# Patient Record
Sex: Female | Born: 1935 | Race: White | Hispanic: No | Smoking: Former smoker
Health system: Southern US, Community
[De-identification: ages and names within clinical notes are randomized; demographics above are authoritative.]

## PROBLEM LIST (undated history)

## (undated) DIAGNOSIS — I495 Sick sinus syndrome: Secondary | ICD-10-CM

## (undated) DIAGNOSIS — I10 Essential (primary) hypertension: Secondary | ICD-10-CM

## (undated) DIAGNOSIS — R55 Syncope and collapse: Secondary | ICD-10-CM

## (undated) DIAGNOSIS — Z950 Presence of cardiac pacemaker: Secondary | ICD-10-CM

## (undated) DIAGNOSIS — E079 Disorder of thyroid, unspecified: Secondary | ICD-10-CM

## (undated) DIAGNOSIS — M1990 Unspecified osteoarthritis, unspecified site: Secondary | ICD-10-CM

## (undated) HISTORY — DX: Presence of cardiac pacemaker: Z95.0

## (undated) HISTORY — DX: Sick sinus syndrome: I49.5

## (undated) HISTORY — DX: Disorder of thyroid, unspecified: E07.9

## (undated) HISTORY — PX: INSERT / REPLACE / REMOVE PACEMAKER: SUR710

## (undated) HISTORY — DX: Unspecified osteoarthritis, unspecified site: M19.90

## (undated) HISTORY — DX: Essential (primary) hypertension: I10

## (undated) HISTORY — DX: Syncope and collapse: R55

---

## 2004-10-05 ENCOUNTER — Ambulatory Visit: Payer: Self-pay | Admitting: Internal Medicine

## 2004-10-14 ENCOUNTER — Ambulatory Visit: Payer: Self-pay

## 2004-10-15 ENCOUNTER — Ambulatory Visit: Payer: Self-pay

## 2004-10-19 ENCOUNTER — Ambulatory Visit: Payer: Self-pay | Admitting: Internal Medicine

## 2004-10-19 ENCOUNTER — Ambulatory Visit (HOSPITAL_COMMUNITY): Admission: RE | Admit: 2004-10-19 | Discharge: 2004-10-19 | Payer: Self-pay | Admitting: Internal Medicine

## 2004-11-03 ENCOUNTER — Ambulatory Visit: Payer: Self-pay

## 2004-11-23 ENCOUNTER — Ambulatory Visit: Payer: Self-pay

## 2004-12-01 ENCOUNTER — Ambulatory Visit: Payer: Self-pay | Admitting: Internal Medicine

## 2004-12-06 ENCOUNTER — Ambulatory Visit: Payer: Self-pay | Admitting: Internal Medicine

## 2004-12-10 ENCOUNTER — Ambulatory Visit (HOSPITAL_COMMUNITY): Admission: RE | Admit: 2004-12-10 | Discharge: 2004-12-11 | Payer: Self-pay | Admitting: Internal Medicine

## 2004-12-10 ENCOUNTER — Ambulatory Visit: Payer: Self-pay | Admitting: Internal Medicine

## 2004-12-27 ENCOUNTER — Ambulatory Visit: Payer: Self-pay

## 2005-04-18 ENCOUNTER — Ambulatory Visit: Payer: Self-pay | Admitting: Internal Medicine

## 2005-06-14 ENCOUNTER — Ambulatory Visit: Payer: Self-pay | Admitting: Internal Medicine

## 2005-10-24 ENCOUNTER — Ambulatory Visit: Payer: Self-pay | Admitting: Internal Medicine

## 2005-11-17 ENCOUNTER — Ambulatory Visit: Payer: Self-pay | Admitting: Internal Medicine

## 2006-01-06 ENCOUNTER — Ambulatory Visit: Payer: Self-pay | Admitting: Cardiology

## 2006-01-30 ENCOUNTER — Inpatient Hospital Stay (HOSPITAL_COMMUNITY): Admission: RE | Admit: 2006-01-30 | Discharge: 2006-02-02 | Payer: Self-pay | Admitting: Orthopedic Surgery

## 2006-04-07 ENCOUNTER — Ambulatory Visit: Payer: Self-pay | Admitting: Internal Medicine

## 2006-06-22 ENCOUNTER — Ambulatory Visit: Payer: Self-pay | Admitting: Internal Medicine

## 2006-12-27 ENCOUNTER — Ambulatory Visit: Payer: Self-pay | Admitting: Internal Medicine

## 2007-03-28 ENCOUNTER — Ambulatory Visit: Payer: Self-pay | Admitting: Internal Medicine

## 2007-07-19 ENCOUNTER — Ambulatory Visit: Payer: Self-pay | Admitting: Internal Medicine

## 2007-10-23 ENCOUNTER — Ambulatory Visit: Payer: Self-pay | Admitting: Internal Medicine

## 2008-04-15 ENCOUNTER — Ambulatory Visit: Payer: Self-pay | Admitting: Internal Medicine

## 2008-08-21 ENCOUNTER — Ambulatory Visit: Payer: Self-pay | Admitting: Internal Medicine

## 2008-11-27 DIAGNOSIS — M1990 Unspecified osteoarthritis, unspecified site: Secondary | ICD-10-CM | POA: Insufficient documentation

## 2008-11-27 DIAGNOSIS — E669 Obesity, unspecified: Secondary | ICD-10-CM

## 2008-11-29 ENCOUNTER — Encounter (INDEPENDENT_AMBULATORY_CARE_PROVIDER_SITE_OTHER): Payer: Self-pay | Admitting: *Deleted

## 2008-12-11 ENCOUNTER — Ambulatory Visit: Payer: Self-pay | Admitting: Internal Medicine

## 2008-12-11 DIAGNOSIS — I495 Sick sinus syndrome: Secondary | ICD-10-CM

## 2009-03-20 ENCOUNTER — Encounter: Payer: Self-pay | Admitting: Internal Medicine

## 2009-04-17 ENCOUNTER — Encounter: Payer: Self-pay | Admitting: Internal Medicine

## 2009-05-11 ENCOUNTER — Ambulatory Visit: Payer: Self-pay | Admitting: Internal Medicine

## 2009-05-28 ENCOUNTER — Encounter: Payer: Self-pay | Admitting: Internal Medicine

## 2009-08-14 ENCOUNTER — Encounter: Payer: Self-pay | Admitting: Internal Medicine

## 2009-09-03 ENCOUNTER — Encounter: Payer: Self-pay | Admitting: Internal Medicine

## 2009-09-11 ENCOUNTER — Encounter: Payer: Self-pay | Admitting: Internal Medicine

## 2009-09-21 ENCOUNTER — Ambulatory Visit: Payer: Self-pay | Admitting: Internal Medicine

## 2009-10-12 ENCOUNTER — Encounter: Payer: Self-pay | Admitting: Internal Medicine

## 2010-03-15 ENCOUNTER — Ambulatory Visit: Payer: Self-pay | Admitting: Internal Medicine

## 2010-05-21 ENCOUNTER — Telehealth: Payer: Self-pay | Admitting: Internal Medicine

## 2010-06-21 ENCOUNTER — Encounter: Payer: Self-pay | Admitting: Internal Medicine

## 2010-08-06 ENCOUNTER — Encounter (INDEPENDENT_AMBULATORY_CARE_PROVIDER_SITE_OTHER): Payer: Self-pay | Admitting: *Deleted

## 2010-08-12 ENCOUNTER — Encounter: Payer: Self-pay | Admitting: Internal Medicine

## 2010-09-07 NOTE — Letter (Signed)
Summary: Device-Delinquent Phone Journalist, newspaper, Main Office  [address]   [place], [place] [address]   Phone: [phone]  Fax: [phone]     September 11, 2009 MRN: [id number]   [facility] [address] [place], [place]  [address]   Dear [name],  According to our records, you were scheduled for a device phone transmission on   August 12, 2009.     We did not receive any results from this check.  If you transmitted on your scheduled day, please call us to help troubleshoot your system.  If you forgot to send your transmission, please send one upon receipt of this letter.  Thank you,   Architectural technologist Device Clinic

## 2010-09-07 NOTE — Letter (Signed)
Summary: Device-Delinquent Phone Journalist, newspaper, Main Office  [address]   [place], [place] [address]   Phone: [phone]  Fax: [phone]     June 21, 2010 MRN: [id number]   [facility] [address] [place], [place]  [address]   Dear Ms. Dufault,  According to our records, you were scheduled for a device phone transmission on 06-17-2010.     We did not receive any results from this check.  If you transmitted on your scheduled day, please call us to help troubleshoot your system.  If you forgot to send your transmission, please send one upon receipt of this letter.  Thank you,   Architectural technologist Device Clinic

## 2010-09-07 NOTE — Progress Notes (Signed)
Summary: QUESTION ON MEDS  Phone Note Call from Patient Call back at [phone]   Caller: SOn/JIM Reason for Call: Talk to Nurse Summary of Call: PT SON JIM HAS QUESTION ON PT MEDS. PT SON IS NOT SURE WHAT MEDS DR. KLEIN PT NEEDS TO TAKE.  JIM WILL LIKE TO TALK TO A NURSE TO GET HIS MOTHER MEDS IN ORDER. Initial call taken by: Roe Coombs,  May 21, 2010 4:05 PM  Follow-up for Phone Call        Pt's son just wanted to know which meds. his mother was taking that Dr. Graciela Husbands has prescribed. I went over the meds. from the last OV with Dr. Graciela Husbands. He repeated those back to me & seems to understand. Whitney Maeola Sarah RN  May 21, 2010 4:19 PM  Follow-up by: Whitney Maeola Sarah RN,  May 21, 2010 4:19 PM

## 2010-09-07 NOTE — Cardiovascular Report (Signed)
Summary: Office Visit   Office Visit   Imported By: Roderic Ovens 03/17/2010 13:22:43  _____________________________________________________________________  External Attachment:    Type:   Image     Comment:   External Document

## 2010-09-07 NOTE — Assessment & Plan Note (Signed)
Summary: pc2/medtronic   Visit Type:  Follow-up Primary Provider:  Dr Barrie Folk  CC:  Pc2.  History of Present Illness: Mrs. Veale he seen in followup for sinus bradycardia and paroxysmal atrial arrhythmias. She's had some intermittent palpitations.    Exercise has been limited by discomfort in her hips. Her weight is up another few pounds.    issues recently has been memory.  The patient denies SOB, chest pain, edema or palpitations   Current Medications (verified): 1)  Metoprolol Tartrate 50 Mg Tabs (Metoprolol Tartrate) .... Take One Tab Two Times A Day 2)  Lotrel 5-20 Mg Caps (Amlodipine Besy-Benazepril Hcl) .Marland Kitchen.. 1 Cap By Mouth Once Daily 3)  Meloxicam 7.5 Mg Tabs (Meloxicam) .Marland Kitchen.. 1 Tab Once Daily 4)  Levothyroxine Sodium 100 Mcg Tabs (Levothyroxine Sodium) .... Take 1 Tab Once Daily 5)  Simvastatin 40 Mg Tabs (Simvastatin) .... Take One Tablet At Bedtime  Allergies (verified): No Known Drug Allergies  Past History:  Past Medical History: Last updated: 11/27/2008 PAST MEDICAL HISTORY:  1.  Symptomatic bradycardia/recurrent syncope.  2.  Hypothyroidism.  3.  Degenerative joint disease.  Vital Signs:  Patient profile:   76 year old female Height:      63 inches Weight:      175.50 pounds BMI:     31.20 Pulse rate:   62 / minute Pulse rhythm:   regular Resp:     18 per minute BP sitting:   128 / 80  (left arm) Cuff size:   large  Vitals Entered By: Vikki Ports (March 15, 2010 11:08 AM)  Physical Exam  General:  The patient was alert and oriented in no acute distress.Neck veins were flat, carotids were brisk. Lungs were clear. Heart sounds were regular without murmurs or gallops. Abdomen was soft with active bowel sounds. There is no clubbing cyanosis or edema.    EKG  Procedure date:  03/15/2010  Findings:      atrial pacing at 62 Intervals 0.16 5.07/.41  PPM Specifications Following MD:  Sherryl Manges, MD     PPM Vendor:  Medtronic     PPM Model  Number:  [id number]     PPM Serial Number:  [id number] PPM DOI:  12/10/2004     PPM Implanting MD:  Sherryl Manges, MD  Lead 1    Location: RA     DOI: 12/10/2004     Model #: [id number]     Serial #: [id number]     Status: active Lead 2    Location: RV     DOI: 12/10/2004     Model #: [id number]     Serial #: [id number]     Status: active   Indications:  Syncope    PPM Follow Up Battery Voltage:  2.99 V     Pacer Dependent:  No       PPM Device Measurements Atrium  Amplitude: 3.8 mV, Impedance: 464 ohms, Threshold: 0.5 V at 0.40 msec Right Ventricle  Amplitude: 6.0 mV, Impedance: 576 ohms, Threshold: 1.0 V at 0.4 msec  Episodes MS Episodes:  0     Ventricular High Rate:  1     Atrial Pacing:  57.3%     Ventricular Pacing:  <0.2%  Parameters Mode:  MVP(R)     Lower Rate Limit:  60     Upper Rate Limit:  130 Paced AV Delay:  180     Sensed AV Delay:  150 Next Remote Date:  06/17/2010  Next Cardiology Appt Due:  03/09/2011 Tech Comments:  1 MONITORED VT EPISODE LASTING 6 SECONDS.  NORMAL DEVICE FUNCTION.  NO CHANGES MADE.  CARELINK CHECK 06-17-10. ROV IN 12 MTHS W/SK. Vella Kohler  March 15, 2010 11:37 AM  Impression & Recommendations:  Problem # 1:  SICK SINUS/ TACHY-BRADY SYNDROME (ICD-427.81)  Stable; status post pacer Her updated medication list for this problem includes:    Metoprolol Tartrate 50 Mg Tabs (Metoprolol tartrate) .Marland Kitchen... Take one tab two times a day    Lotrel 5-20 Mg Caps (Amlodipine besy-benazepril hcl) .Marland Kitchen... 1 cap by mouth once daily  Her updated medication list for this problem includes:    Metoprolol Tartrate 50 Mg Tabs (Metoprolol tartrate) .Marland Kitchen... Take one tab two times a day    Lotrel 5-20 Mg Caps (Amlodipine besy-benazepril hcl) .Marland Kitchen... 1 cap by mouth once daily  Orders: EKG w/ Interpretation ([address])  Problem # 2:  PACEMAKER, PERMANENT-MEDTRONIC ENRHYTHM P1501DR (ICD-V45.01)  Device parameters and data were reviewed and no changes were made  Orders: EKG w/  Interpretation ([address])  Patient Instructions: 1)  Your physician wants you to follow-up in: 1 YEAR with Dr Graciela Husbands in [place].  You will receive a reminder letter in the mail two months in advance. If you don't receive a letter, please call our office to schedule the follow-up appointment. 2)  Your physician recommends that you continue on your current medications as directed. Please refer to the Current Medication list given to you today.

## 2010-09-07 NOTE — Letter (Signed)
Summary: Device-Delinquent Phone Journalist, newspaper, Main Office  [address]   [place], [place] [address]   Phone: [phone]  Fax: [phone]     August 14, 2009 MRN: [id number]   [facility] [address] [place], [place]  [address]   Dear Ms. Azbell,  According to our records, you were scheduled for a device phone transmission on  August 12, 2009.     We did not receive any results from this check.  If you transmitted on your scheduled day, please call us to help troubleshoot your system.  If you forgot to send your transmission, please send one upon receipt of this letter.  Thank you,   Architectural technologist Device Clinic

## 2010-09-07 NOTE — Cardiovascular Report (Signed)
Summary: Office Visit Remote   Office Visit Remote   Imported By: Roderic Ovens 10/14/2009 10:05:58  _____________________________________________________________________  External Attachment:    Type:   Image     Comment:   External Document

## 2010-09-07 NOTE — Letter (Signed)
Summary: Remote Device Check  [employer], Main Office  [address]   [place], [place] [address]   Phone: [phone]  Fax: [phone]     October 12, 2009 MRN: [id number]   [facility] [address] [place], [place]  [address]   Dear Ms. Pullin,   Your remote transmission was recieved and reviewed by your physician.  All diagnostics were within normal limits for you.    ___X___Your next office visit is scheduled for:    MAY 2011 WITH DR Graciela Husbands. Please call our office to schedule an appointment.    Sincerely,  Proofreader

## 2010-09-09 NOTE — Cardiovascular Report (Signed)
Summary: Certified Letter Signed - Patient (not doing f/u)  Certified Letter Signed - Patient (not doing f/u)   Imported By: Debby Freiberg 08/25/2010 17:08:13  _____________________________________________________________________  External Attachment:    Type:   Image     Comment:   External Document

## 2010-09-09 NOTE — Letter (Signed)
Summary: Device-Delinquent Phone Journalist, newspaper, Main Office  [address]   [place], [place] [address]   Phone: [phone]  Fax: [phone]     August 06, 2010 MRN: [id number]   [facility] [address] [place], [place]  [address]   Dear Ms. Rumbaugh,  According to our records, you were scheduled for a device phone transmission on  06-17-2010 .     We did not receive any results from this check.  If you transmitted on your scheduled day, please call us to help troubleshoot your system.  If you forgot to send your transmission, please send one upon receipt of this letter.  Thank you,  Vella Kohler  August 06, 2010 1:55 PM   [facility] certified

## 2010-10-01 ENCOUNTER — Encounter: Payer: Self-pay | Admitting: Internal Medicine

## 2010-10-01 ENCOUNTER — Encounter (INDEPENDENT_AMBULATORY_CARE_PROVIDER_SITE_OTHER): Payer: Medicare Other | Admitting: Internal Medicine

## 2010-10-01 DIAGNOSIS — Z950 Presence of cardiac pacemaker: Secondary | ICD-10-CM

## 2010-10-01 DIAGNOSIS — I10 Essential (primary) hypertension: Secondary | ICD-10-CM

## 2010-10-01 DIAGNOSIS — I498 Other specified cardiac arrhythmias: Secondary | ICD-10-CM

## 2010-10-01 DIAGNOSIS — I472 Ventricular tachycardia: Secondary | ICD-10-CM

## 2010-10-14 NOTE — Cardiovascular Report (Signed)
Summary: Office Visit   Office Visit   Imported By: Roderic Ovens 10/07/2010 15:52:26  _____________________________________________________________________  External Attachment:    Type:   Image     Comment:   External Document

## 2010-10-14 NOTE — Assessment & Plan Note (Signed)
Summary: physician clinic   Allergies: No Known Drug Allergies   PPM Specifications Following MD:  Sherryl Manges, MD     PPM Vendor:  Medtronic     PPM Model Number:  [id number]     PPM Serial Number:  [id number] PPM DOI:  12/10/2004     PPM Implanting MD:  Sherryl Manges, MD  Lead 1    Location: RA     DOI: 12/10/2004     Model #: 1093     Serial #: [id number]     Status: active Lead 2    Location: RV     DOI: 12/10/2004     Model #: 2542     Serial #: [id number]     Status: active   Indications:  Syncope    PPM Follow Up Pacer Dependent:  No      Parameters Mode:  MVP(R)     Lower Rate Limit:  60     Upper Rate Limit:  130 Paced AV Delay:  180     Sensed AV Delay:  150

## 2010-10-20 ENCOUNTER — Encounter: Payer: Self-pay | Admitting: Cardiovascular Disease

## 2010-10-29 NOTE — Letter (Deleted)
October 01, 2010    RE:  [name], [name] MRN:  [id number]  /  DOB:  May 24, 1929  [name] is seen in follow-up for sinus node dysfunction for which she is status post pacemaker implantation.  She has had no recurrent syncope.  She continues to adjust to life after the loss of her husband of many, many years.  Palpitations previously identified have been largely quiescent.  She denies chest pain or shortness of breath.  MEDICATIONS:  Currently include: 1. Amlodipine/benazepril 5/20. 2. Levothyroxine 150. 3. Metoprolol tartrate 50 b.i.d.  PHYSICAL EXAMINATION:  VITAL SIGNS:  On examination her blood pressure is still mildly elevated at 149/83.  Her pulse was 74. LUNGS:  Clear. HEART:  Sounds were regular. ABDOMEN:  Soft with active bowel sounds. EXTREMITIES:  Without edema. SKIN:  Warm and dry. NEURO:  She was alert and oriented, in no acute distress.  Neurological exam was grossly. MUSCULOSKELETAL:  No major musculoskeletal deformities.  Her pacemaker was interrogated and demonstrated a Medtronic in rhythm with a battery voltage of 2.99.  Impedances were [phone]-V, amplitudes 2.8-P 5.6-R, thresholds today were  0.5/0.4 and 1 volt/0.4.  Heart rate excursion was adequate, atrial pacing was noted 57% of the time she does not ventricularly pace.  IMPRESSION: 1. Sinus node dysfunction. 2. Hypertension. 3. Status post pacemaker for #1. 4. Nonsustained ventricular tachycardia.  The patient is doing quite well.  Previous assessments of left ventricular function undertaken __________  were normal.  She continues to accommodate to the loss of her husband.  Ejection fraction was 55% most recently, March of 2006, with Myoview scanning.  We will plan to see her again in six months' time in the device clinic.   Sincerely,     Duke Salvia, MD, [facility]   SCK/MedQ  DD: 10/01/2010  DT: 10/01/2010  Job #: [id number]

## 2010-12-10 NOTE — Assessment & Plan Note (Signed)
[facility]                       [place] CARDIOLOGY OFFICE NOTE  NAME:PAGEChelsae, [name]                       MRN:          [id number] DATE:10/01/2010                            DOB:          08/23/1928   [name] is seen in follow-up for sinus node dysfunction for which she is status post pacemaker implantation.  She has had no recurrent syncope.  She continues to adjust to life after the loss of her husband of many, many years.  Palpitations previously identified have been largely quiescent.  She denies chest pain or shortness of breath.  MEDICATIONS:  Currently include: 1. Amlodipine/benazepril 5/20. 2. Levothyroxine 150. 3. Metoprolol tartrate 50 b.i.d.  PHYSICAL EXAMINATION:  VITAL SIGNS:  On examination her blood pressure is still mildly elevated at 149/83.  Her pulse was 74. LUNGS:  Clear. HEART:  Sounds were regular. ABDOMEN:  Soft with active bowel sounds. EXTREMITIES:  Without edema. SKIN:  Warm and dry. NEURO:  She was alert and oriented, in no acute distress.  Neurological exam was grossly. MUSCULOSKELETAL:  No major musculoskeletal deformities.  Her pacemaker was interrogated and demonstrated a Medtronic in rhythm with a battery voltage of 2.99.  Impedances were [phone]-V, amplitudes 2.8-P 5.6-R, thresholds today were  0.5/0.4 and 1 volt/0.4.  Heart rate excursion was adequate, atrial pacing was noted 57% of the time she does not ventricularly pace.  IMPRESSION: 1. Sinus node dysfunction. 2. Hypertension. 3. Status post pacemaker for #1. 4. Nonsustained ventricular tachycardia.  The patient is doing quite well.  Previous assessments of left ventricular function undertaken __________  were normal.  She continues to accommodate to the loss of her husband.  Ejection fraction was 55% most recently, March of 2006, with Myoview scanning.  We will plan to see her again in six months' time in the device clinic.   Duke Salvia, MD, [facility]   SCK/MedQ  DD: 10/01/2010  DT: 10/01/2010  Job #: [id number]

## 2010-12-21 NOTE — Assessment & Plan Note (Signed)
[facility]                         ELECTROPHYSIOLOGY OFFICE NOTE   NAME:PAGEYoulanda, Tomassetti                       MRN:          [id number]  DATE:07/19/2007                            DOB:          10-18-1928    Mrs. Nardo has seen her husband recently pass away at [facility]  following a fall.  She has syncope in the context of bradycardia and she  is status post pacemaker implantation without recurrent events.   MEDICATIONS:  Include Toprol 50, Lotrel, Synthroid 112.   EXAMINATION:  Her blood pressure is 136/86, pulse is 64.  Her lungs were  clear.  Heart sounds were regular. Extremities were without edema.   INTERROGATION OF HER MEDTRONIC PULSE GENERATOR:  Demonstrates a P wave  of 2.6 with impedance of 440, threshold of 0.5 and 0.4, the R wave is 74  with impedance of 40 and threshold of 0.5 and 0.4.   IMPRESSION:  1. Bradycardia.  2. Syncope.  3. Status post pacer for the above.   Mrs. Renn is stable and we will see her again in 1 year's time.     Duke Salvia, MD, [facility]  Electronically Signed    SCK/MedQ  DD: 07/19/2007  DT: 07/19/2007  Job #: [id number]

## 2010-12-24 NOTE — Assessment & Plan Note (Signed)
[facility]                           ELECTROPHYSIOLOGY OFFICE NOTE   NAME:PAGEChristinna, [name]                       MRN:          [id number]  DATE:04/07/2006                            DOB:          1929/05/10    [name] is seen.  She has a history of 6 beats of bradycardia, status post  pacemaker implantation.  She is doing well without complaint.   MEDICATIONS:  Reviewed and are notable for the inter current up titration of  her Synthroid.   PHYSICAL EXAMINATION:  VITAL SIGNS:  Her blood pressure was 143/86.  Her  pulse was 70.  LUNGS:  Clear.  HEART:  Sounds were regular.  EXTREMITIES:  Without edema.   Interrogation of her Medtronic pulse generator demonstrates a P wave 3.7  with impedance of 480 and a threshold of a 0.5 at __________ with an R wave  of 8.1 and __________ threshold of 0.5 at 0.4 __________ 2 and there was not  an episode of nonsustained ventricular tachycardia.  This occurred in the  context of a normal heart.   IMPRESSION:  1. A 6 __________  .  2. Status post __________  without recurrence.  3. Status post recent hip surgery.  4. Nonsustained ventricular tachycardia __________  .   PLAN:  __________  right now without further cardiac evaluation not  withstanding nonsustained VT.  Her pacemaker function is fine.                                   Duke Salvia, MD, [facility]   SCK/MedQ  DD:  04/07/2006  DT:  04/07/2006  Job #:  [id number]   cc:   Desmond Dike, Dr.

## 2010-12-24 NOTE — Letter (Signed)
June 22, 2006    Desmond Dike, M.D.  [address]  [place], [place] [address]   RE:  KHAILA, VELARDE  MRN:  [id number]  /  DOB:  01-04-1929   Dear Jonny Ruiz:   I hope this letter finds you well and I wish you guys a happy Thanksgiving  and a merry Christmas. Ms. Smotherman comes in today, she has had these funny  spells a couple of weeks ago where she was just nauseated and hot and did  not feel very well. The symptoms abated. She had a perisystolic just a  couple of months also. She is back now to feeling really quite well.  Her  medications are unchanged.   EXAMINATION:  VITAL SIGNS: Her blood pressure is 136/78, her pulse is 65.  LUNGS: Clear.  HEART: Sounds are regular.  EXTREMITIES: Were without edema.   IMPRESSION:  1. Sinus node dysfunction status post pacemaker implantation.  2. Dizzy spells that sound vaso depressive with nausea and other things,      question trigger.  3. Diastolic heart failure.  4. Obesity.   Ms. Zuk is stable. Will plan to see her again in 1 year's time. If there is  anything I can do in the interim, John, please do not hesitate to contact  me.    Sincerely,      Duke Salvia, MD, [facility]  Electronically Signed    SCK/MedQ  DD: 06/22/2006  DT: 06/22/2006  Job #: [id number]

## 2010-12-24 NOTE — Op Note (Signed)
NAME:  [name], [name]              ACCOUNT NO.:  [id number]   MEDICAL RECORD NO.:  [id number]          PATIENT TYPE:  OIB   LOCATION:  2899                         FACILITY:  [facility]   PHYSICIAN:  Duke Salvia, M.D.  DATE OF BIRTH:  06/22/1929   DATE OF PROCEDURE:  10/19/2004  DATE OF DISCHARGE:                                 OPERATIVE REPORT   PREOPERATIVE DIAGNOSIS:  Recurrent syncope.   POSTOPERATIVE DIAGNOSIS:  Recurrent syncope.   PROCEDURE PERFORMED:  Implantable loop insertion.   DESCRIPTION OF PROCEDURE:  Following the obtaining of informed consent, the  patient was brought to the electrophysiology laboratory and placed on the  fluoroscopic table in supine position.  After routine prep and drape and  transcutaneous mapping, an incision was made and carried down to the layer  of the prepectoral fascia using electrocautery and sharp dissection.  A  pocket was formed similarly, hemostasis was obtained.  Thereafter hemostasis  was obtained.  Two 0 silk sutures were placed at the cephalad portion of the  pocket and these were used to affix a Reveal Plus monitor [id number].  The  wound was then closed in three layers in normal fashion.  The wound was  washed and dried and benzoin Steri-Strip dressing was applied.  Sponge,  needle and instrument counts were correct at the end of the procedure  according to the staff.  The patient tolerated the procedure without  apparent complication.   The patient's procedure was somewhat more tedious than normal because of the  patient's obesity and the need to create a pocket nearly an inch and a half  below the surface.      SCK/MEDQ  D:  10/19/2004  T:  10/19/2004  Job:  [id number]   cc:   Cliffton Asters, M.D.  [address] [place]  [place] [address]  Fax: [phone]   Electrophys. lab.   [place] Pacemaker cl.

## 2010-12-24 NOTE — Discharge Summary (Signed)
NAME:  [name], [name]              ACCOUNT NO.:  [id number]   MEDICAL RECORD NO.:  [id number]          PATIENT TYPE:  OIB   LOCATION:  3729                         FACILITY:  [facility]   PHYSICIAN:  Maple Mirza, P.A. DATE OF BIRTH:  1928-11-10   DATE OF ADMISSION:  12/10/2004  DATE OF DISCHARGE:  12/11/2004                                 DISCHARGE SUMMARY   DISCHARGE DIAGNOSES:  1.  Symptomatic bradycardia/presyncope/syncope.  2.  Status post loop implant.  3.  Implantation of Medtronic pacemaker, Dec 10, 2004.  4.  Discharging day number one, status post implantation of a pacemaker,      status post proper interrogation, status post chest x-ray, status post      incision check.   SECONDARY DIAGNOSES:  1.  Hypothyroidism.  2.  Degenerative joint disease.  3.  Status post cholecystectomy.  4.  Status post appendectomy.  5.  Status post left hip arthroplasty.   PROCEDURE:  1.  Dec 10, 2004:  Implantation of Medtronic permanent pacemaker by Dr.      Sherryl Manges.  2.  Dec 10, 2004:  Status post Medtronic loop recorder explant.   DISCHARGE DISPOSITION:  Patient discharged on day number one after  implantation of pacemaker.  She has been afebrile.  She has had no cardiac  dysrhythmias, no respiratory distress.  She has had no recurrence of  syncope.   DISCHARGE MEDICATIONS:  1.  Unithroid 150 mcg one tab for 3 days, one-half tab for 4 days.  2.  Toprol-XL 100 mg one-half tab daily.  3.  Mobic 7.5 mg daily.   SPECIAL INSTRUCTIONS:  The patient is to remove the bandage on the morning  of Dec 11, 2004, and leave the incision open to the air.  She is asked to  keep the incision dry over the next seven days and sponge bathe until  Friday, May 12th.  Tylenol has been ordered for pain, 1-2 tablets q.4-6h as  needed.  A mobility sheet has been given to the patient describing  instructions for moving of the left arm for the next week.  Follow up is at  the [facility], [facility], [address] on  Monday, May 22nd, at 3:30, and she will see Dr. Graciela Husbands in three months.  As  always, we will call with that appointment.   BRIEF HISTORY:  [name] is a 77 year old female.  She has a history of  recurrent syncope.  She was seen by Dr. Graciela Husbands first in February of 2006 with  the recommendation at that time for a loop recorder since the syncope was of  unclear etiology.  In mid-April of 2006, she had a marked episode of  presyncope.  The recorder demonstrated a sinus bradycardia with a heart rate  in the 20s.  She is here for a loop explant and pacemaker implantation.  The  patient had an Adenosine-Myoview in March of 2006, and ejection fraction was  55%, a small defect of the apex with a question of trivial ischemia, left  ventricular wall motion is normal.  The  patient denies any prior history of  diabetes, myocardial infarction, cerebrovascular accident, hypertension,  dyslipidemia, pulmonary embolism, deep venous thrombosis, seizure.  Patient  presents May 5th for explantation of loop recorder and implantation of  permanent pacemaker.   HOSPITAL COURSE:  The patient presenting electively May 5th for procedure.  The loop recorder was explanted successfully, with implantation of Medtronic  EnRhythm permanent pacemaker by Dr. Graciela Husbands.  Patient has had no complaints in  the post-procedure period and discharged day number one with the medications  and followup as dictated.      GM/MEDQ  D:  12/10/2004  T:  12/11/2004  Job:  [id number]   cc:   Desmond Dike, M.D.   Duke Salvia, M.D.

## 2010-12-24 NOTE — Op Note (Signed)
NAME:  [name], [name]              ACCOUNT NO.:  [id number]   MEDICAL RECORD NO.:  [id number]          PATIENT TYPE:  INP   LOCATION:  2899                         FACILITY:  [facility]   PHYSICIAN:  Mila Homer. Sherlean Foot, M.D. DATE OF BIRTH:  [birth year]   DATE OF PROCEDURE:  01/30/2006  DATE OF DISCHARGE:                                 OPERATIVE REPORT   SURGEON:  Mila Homer. Sherlean Foot, M.D.   ASSISTANT:  Richardean Canal, P.A.   ANESTHESIA:  General.   PREOPERATIVE DIAGNOSIS:  Right hip osteoarthritis.   POSTOPERATIVE DIAGNOSIS:  Right hip osteoarthritis.   PROCEDURE:  Right hip total hip arthroplasty.   INDICATIONS FOR PROCEDURE:  The patient is a 77 year old white female with  failure of conservative measures for osteoarthritis of the right hip.  Informed consent was obtained.   DESCRIPTION OF PROCEDURE:  The patient was laid supine and administered  general anesthesia.  A Foley catheter was placed, and the patient was placed  in the right up, left down lateral decubitus position.  The right hip was  sterilely prepped and draped in the usual sterile fashion.  A southern  exposure of the hip was obtained with an 8 cm curvilinear incision centered  over the trochanter.  A fresh blade was used to make an incision down  through the fat and soft tissue to the fascia lata.  Cautery was used for  hemostasis.  I then incised the fascia lata along the length of the incision  and held it in place with a Charnley retractor.  Then, I made an incision  through the anterior one-half of the gluteus medius, all the minimus, and  the anterior 50% of the lateralis, elevated this as a single sleeve of  tissue, exposing the anterior hip capsule.  I tagged this with three stay  sutures of #2 Tevdek.  I then performed an anterior hip capsulectomy.  This  loosened up the hip significantly.  I then placed a retractor anteriorly and  posteriorly to the femoral neck and marked out the femoral neck cut with the  neck cutting guide, and made this cut with the reciprocating saw.  I then  removed the head and neck segment.  I placed a Holman retractor anteriorly  and posteriorly and switched sides of the table with my P.A.  I then  sequentially reamed up to 52, and then tamped in a 54 mm fiber mesh, no-  hole, no-spike cup.  I then tamped in the standard polyethylene liner to  accept a 32 mm head.  I then switched back to the backside of the table,  externally rotated the foot into a sterile pouch.  I placed a mill retractor  on the posterior surface of the cut femoral neck.  I then used a canal  finder.  As I pushed the canal finder down the posterior trochanter, there  was a crack in the posterior trochanter.  This was nondisplaced, from very  osteoporotic bone.  There was actually no cancellous bone anywhere in the  metaphyseal region or in the trochanter.  I continued the operation with  sequentially  reaming diaphyseally down to 13, and then proximally broaching  to 13 and realized there was a diaphyseal-metaphyseal mismatch.  I then  placed the 14 broach into place that would account for a wider metaphyseal  region, and then trialed with 0 and +3.5 heads and chose a +3.5 head.  I  then removed the trial components, copiously irrigated, and put in the real  fully porous coated 13 x 14 offset stem and tamped on a +3.5 mm x 32 mm  head, went to a clean Morse taper, and located the hip.  I then placed two  cables around the trochanter, one inferiorly and one superiorly beneath the  abductor sleeve.  I tightened these down.  It all moved as a unit.  I did  take an x-ray to ensure that everything was anatomically reduced.  I then  irrigated and closed the lateralis/medius/minimus sleeve through drill holes  in the trochanter, then oversewed that with several figure-of-eight #2  Tevdek sutures, and then #1 Vicryl sutures in figure-of-eight in the fascia  lata, 0s in the deep soft tissues, 2-0s in the  subcuticular layer, and skin  staples.   DRAINS:  None.   COMPLICATIONS:  None.   ESTIMATED BLOOD LOSS:  300 cc.   DRESSING:  Xeroform dressing, sponges, and sterile Ioban drape.           ______________________________  Mila Homer. Sherlean Foot, M.D.     SDL/MEDQ  D:  01/30/2006  T:  01/30/2006  Job:  [id number]

## 2010-12-24 NOTE — H&P (Signed)
NAME:  [name], [name]              ACCOUNT NO.:  [id number]   MEDICAL RECORD NO.:  [id number]          PATIENT TYPE:  OIB   LOCATION:  2876                         FACILITY:  [facility]   PHYSICIAN:  Duke Salvia, M.D.  DATE OF BIRTH:  27-Jan-1929   DATE OF ADMISSION:  12/10/2004  DATE OF DISCHARGE:                                HISTORY & PHYSICAL   PRIMARY CARE PHYSICIAN:  Dr. Desmond Dike   CARDIOLOGIST/ELECTROPHYSIOLOGIST:  Dr. Sherryl Manges   PRESENTING CIRCUMSTANCE:  I am here for pacemaker placement.   HISTORY OF PRESENT ILLNESS:  [name] is a 77 year old female.  She has no  history of coronary artery disease.  She does have a history of recurrent  syncope.  She was seen by Dr. Graciela Husbands first in February 2006 with  recommendation at that time for a loop recorder implant.  In mid April 2006  she had a marked episode of presyncope.  Her recorder demonstrated sinus  bradycardia with a heart rate in the 20s.  She is here for a loop  explantation and pacemaker placement.  Of note, the patient had an adenosine  Myoview March 2006 that showed ejection fraction 55% with a small defect at  the apex.  There was some question of trivial ischemia.  Left ventricular  wall motion was normal.  Patient denies any prior history of diabetes,  myocardial infarction, cerebrovascular accident, hypertension, or  dyslipidemia.   ALLERGIES:  I will find this out.   MEDICATIONS:  1.  Unithroid 150 mcg one tablet daily for three days, then one-half tablet      for four days.  2.  Toprol XL 100 mg one-half tablet daily.  3.  Mobic 7.5 mg daily.   SOCIAL HISTORY:  The patient lives in [place] with her husband.  She used  to smoke, but quit 12 years ago.  Does not partake of alcoholic beverages.  No recreational drugs.   FAMILY HISTORY:  Father died of Alzheimer's, was a Careers adviser.  Mother is  deceased.  She had heart problems.  She has one brother who is alive and  well.   PAST MEDICAL HISTORY:  1.  Symptomatic bradycardia/recurrent syncope.  2.  Hypothyroidism.  3.  Degenerative joint disease.   PAST SURGICAL HISTORY:  1.  Status post cholecystectomy.  2.  Status post appendectomy.  3.  Status post left total hip arthroplasty.   REVIEW OF SYSTEMS:  The patient is having no fevers, chills.  She does have  night sweats.  She seems to be hot all the time.  She has no significant  weight gain or loss.  The patient has no photophobia, diplopia, hoarseness,  or vertigo.  INTEGUMENT:  No rashes or nonhealing ulcerations.  CARDIOPULMONARY:  No chest pain.  She is short of breath when she walks but  her hips limit her walking distance.  She is not experiencing paroxysmal  nocturnal dyspnea, orthopnea, edema, or palpitations.  She does have a  history of recurrent syncope and presyncope now associated with known  bradycardia.  UROGENITAL:  Patient does have nocturia.  She does  have  urinary urgency.  GI:  She has no history of peptic ulcer disease,  gastroesophageal reflux disease, GI bleed, nausea, vomiting, or diarrhea.  MUSCULOSKELETAL:  Patient has arthralgias, particularly of the hips and  knees.  As mentioned above, she is status post left total hip arthroplasty  and arthritis does affect her gait which is kind of a splay-footed shambling  gait.  NEUROLOGIC:  No weakness, numbness, anxiety, or depression.   PHYSICAL EXAMINATION:  VITAL SIGNS:  Temperature 97.6, blood pressure  121/66, pulse 68 and regular, respirations 52, weight 200 pounds, height 5  feet 2 inches.  GENERAL:  She is alert and oriented x3 and comfortable, quite articulate  historian.  LUNGS:  Clear to auscultation, percussion bilaterally.  HEENT:  Eyes:  Pupils are equal, round, and reactive to light.  Extraocular  movements are intact.  Nares are patent.  The patient does not wear  dentures.  NECK:  Supple.  No thyromegaly.  No carotid bruits auscultated.  No jugular  venous distention.  No cervical  lymphadenopathy.  HEART:  Regular rate and rhythm without murmur.  ABDOMEN:  Soft.  Bowel sounds are present.  No guarding or rebound  tenderness.  No hepatosplenomegaly.  Abdominal aorta is nonpulsatile.  NEUROLOGIC:  No deficits observed.   IMPRESSION:  1.  Symptomatic bradycardia with presyncope and recurrent syncope.  2.  Status post loop implant.  3.  Degenerative joint disease.   PLAN:  Loop explant with pacemaker implantation by Dr. Sherryl Manges.      GM/MEDQ  D:  12/10/2004  T:  12/10/2004  Job:  [id number]

## 2010-12-24 NOTE — Op Note (Signed)
NAME:  PAGEMarvetta, [name]              ACCOUNT NO.:  [id number]   MEDICAL RECORD NO.:  [id number]          PATIENT TYPE:  OIB   LOCATION:  2876                         FACILITY:  [facility]   PHYSICIAN:  Duke Salvia, M.D.  DATE OF BIRTH:  05/05/1929   DATE OF PROCEDURE:  12/10/2004  DATE OF DISCHARGE:                                 OPERATIVE REPORT   PREOPERATIVE DIAGNOSIS:  Syncope with documented sinus node dysfunction.   POSTOPERATIVE DIAGNOSIS:  Syncope with documented sinus node dysfunction.   PROCEDURE PERFORMED:  Explantation of a previously implanted loop recorder  and implantation of dual chamber pacemaker using contrast venography.   DESCRIPTION OF PROCEDURE:  Following the obtaining of informed consent, the  patient was brought to the electrophysiology laboratory and placed on the  fluoroscopic table in supine position.  After routine prep and drape of the  left upper chest, lidocaine was infiltrated in the subclavicular prepectoral  area.  An incision was made and carried down to the layer of prepectoral  fascia using electrocautery and sharp dissection.  A pocket was formed  similarly.  Hemostasis was obtained.   At that point, attention was turned to trying to remove the previously  implanted loop recorder from the initial pocket.  This turned out to be a  little bit laborious but we were successful ultimately in removing the  device as well as the retaining sutures.  Because it had been put into the  prepectoral fat as opposed to on the fascia, I chose not to close the pocket  feeling that there would be ample drainage.   At this point attention was turned to gaining access to the extrathoracic  left subclavian vein which turned out to be quite difficult.  We actually  did a contrast venogram at this point to facilitate.  This was markedly  valuable.  Two separate venipunctures were accomplished __________ 7 French  tear-away introducer sheaths were placed through  which were then passed  Medtronic 5076 52 cm active fixation ventricular lead, serial #[id number]  and a Medtronics 5076 45 cm active fixation atrial lead, serial number  [id number].  Under fluoroscopic guidance, these leads were manipulated to  the right ventricular septum and the right atrial appendage respectively  where the bipolar R-wave was 6.1 mV with a pacing impedance of 718 ohms with  a threshold of 0.5 V at 0.5 msec with a current at threshold of 0.8 mA.  The  bipolar P-wave was 3.2 mV with pacing impedance of 737, a threshold of 1.3 V  and 0.5 msec shortly after lead deployment and a current at threshold of 2.0  mA.  With these acceptable parameters recorded, the system was implanted.  The ventricular lead was marked with a tie.  The leads were secured to the  prepectoral fascia and then attached to a Medtronic Enrhythm P1501DR pulse  generator, serial #[id number].  Ventricular pacing and P-synchronous pacing  were identified.  The pocket was copiously irrigated with antibiotic  containing saline solution.  Hemostasis was assured.  The leads and the  pulse generator were placed in the pocket, secured  to the prepectoral  fascia.  The wound was then closed in three layers in a normal fashion.  The  wound was  washed and dried and Benzoin and Steri-Strip dressing was applied.  Sponge,  needle and instrument counts were correct at the end of the procedure  according to the staff. The patient tolerated the procedure without apparent  complications.      SCK/MEDQ  D:  12/10/2004  T:  12/10/2004  Job:  [id number]   cc:   Dr. Desmond Dike, [place]   Electrophys lab   [facility]

## 2010-12-24 NOTE — Assessment & Plan Note (Signed)
 HEALTHCARE                         ELECTROPHYSIOLOGY OFFICE NOTE   NAME:PAGERonneisha, [name]                       MRN:          [id number]  DATE:1928/11/28                            DOB:          1929/02/14    The patient is seen in followup for syncope with bradycardia.  She is  status post pacemaker implantation without recurrent syncope.  She  continues to adjust after the death to her husband after 55 years of  marriage.   MEDICATIONS:  1. Toprol 50.  2. Lotrel 5/20.  3. Synthroid 112 mcg.   PHYSICAL EXAMINATION:  GENERAL:  She is healthy and spry, belying her 79  years.  VITAL SIGNS:  Her blood pressure is 139/77 with a pulse of 72 and the  weight is 207, which I did notice until now up 16 pounds since December  and up 20 pounds since August 2007.  LUNGS:  Clear.  HEART:  Sounds were regular.  EXTREMITIES:  Without edema.   Interrogation of her pulse generator demonstrated Medtronic with a P-  wave of 2.4, impedance of 464, threshold of 1 volt at 0.4.  The R-wave  of 7, impedance of 584, and threshold of 1 at 0.4.   She is 70% atrial paced.   IMPRESSION:  1. Sinus node dysfunction with syncope.  2. Status post pacemaker for sinus node dysfunction with syncope.  3. Progressive weight gain.   Mrs. [name] is doing well.  I will plan to see her again in 1 year's time.     Duke Salvia, MD, [facility]  Electronically Signed    SCK/MedQ  DD: 04/15/2008  DT: 04/16/2008  Job #: [id number]   cc:   Cliffton Asters, M.D.

## 2010-12-24 NOTE — Discharge Summary (Signed)
NAME:  [name], [name]              ACCOUNT NO.:  [id number]   MEDICAL RECORD NO.:  [id number]          PATIENT TYPE:  INP   LOCATION:  5033                         FACILITY:  [facility]   PHYSICIAN:  Mila Homer. Sherlean Foot, M.D. DATE OF BIRTH:  [birth year]   DATE OF ADMISSION:  01/30/2006  DATE OF DISCHARGE:                                 DISCHARGE SUMMARY   ADMISSION DIAGNOSES:  1.  Severe osteoarthritis right hip status post left hip replacement.  2.  Cardiac pacemaker.  3.  Hypertension.  4.  Hypothyroidism.   DISCHARGE DIAGNOSES:  1.  End-stage osteoarthritis right hip status post right total hip      arthroplasty.  2.  History of left total hip arthroplasty.  3.  Acute blood loss anemia secondary to surgery.  4.  Constipation.  5.  Cardiac pacemaker.  6.  Hypertension.  7.  Hypothyroidism.   SURGICAL PROCEDURES:  On January 30, 2006, [name] underwent a right total  hip arthroplasty with placement of Mattie Marlin cables for her greater  trochanteric fracture by Dr. Mila Homer. Lucey, assisted by Richardean Canal, PA-  C.  She had a long Jevity liner cross length poly 32 mm inner diameter at  outer diameter 50-54 with a Trilogy Acetabular System shell without holes  porous coated 54 mm outer diameter.  A VerSys Hip System femoral thin beaded  full coat collared 12/14 neck tapered standard neck offset size 13 femoral  stem with a femoral head 12/14 taper 32 mm diameter plus 3.5-mm neck length.  And then 2 cable-ready cable grip systems cerclage cables with crimps were  used.   COMPLICATIONS:  Right greater trochanter fracture which was fixed  intraoperatively.   CONSULTS:  1.  Physical therapy consult and case management consult January 31, 2006.  2.  Occupational therapy consult February 01, 2006.   HISTORY OF PRESENT ILLNESS:  This 77 year old white female patient presented  to Dr. Sherlean Foot with a history of a left hip replacement.  She also has now  extreme right hip pain which is  intermittently sharp, stabbing and aching.  It awakens her at night and is worse with activity.  Rest has improved her  symptoms.  She has very minimal rotation of the hip and x-rays show severe  end-stage arthritic changes.  Because of this, she is presenting for a right  hip replacement.   HOSPITAL COURSE:  [name] tolerated her surgical procedure well without  immediate postoperative complications.  She was transferred to 5000.  Postop  day #1, T-max was 98.5, vitals were stable.  Hemoglobin 10.9, hematocrit  32.3.  Leg was neurovascularly intact with minimal drainage on the dressing.  She was started on therapy per protocol.   On postop day #2, hemoglobin had dropped to 8.8 with hematocrit of 26.2.  She was subsequently transfused with 2 units of packed red blood cells.  Right hip incision was well-approximated with staples with minimal drainage.  She was continued on therapy.   On postop day #3, she is doing well.  Pain is well-controlled with p.o.  meds.  Her hemoglobin is  stable at 10.9 with hematocrit of 31.5.  Legs  neurovascularly intact.  It is felt she is ready for discharge home after  she voids and has a bowel movement and she will be discharged home later  today.   DISCHARGE INSTRUCTIONS:  Diet:  She is to resume her regular  prehospitalization diet.   MEDICATIONS:  She may resume her prehospitalization meds except no Mobic  while on her Lovenox.  Home meds include:   1.  Lotrel 5/20 mg 1 tablet p.o. q.a.m.  2.  Toprol XL 50 mg 1 tablet p.o. q.a.m.  3.  Levothyroxine 112 mcg 1 tablet p.o. q.a.m.  4.  Mobic 7.5 mg 1 tablet p.o. q.a.m.   Additional meds at this time include:  1.  Lovenox 40 mg subcu q.a.m. with the last dose to be on February 13, 2006.  2.  Percocet 1-2 tablets p.o. q.4 h p.r.n. for pain.  3.  Skelaxin 400 mg 1-2 tablets p.o. q. 6-8 hours p.r.n. for spasms.   ACTIVITY:  She can be out of bed, weightbearing as tolerated on the right  leg with the use  of walker.  She is to have home health PT per [facility].  Please see the blue total hip discharge sheet for further  activity instructions.   WOUND CARE:  She may shower after no drainage from the wound for 2 days.  Please see the blue total hip discharge sheet for further wound care  instructions.   FOLLOWUP:  She needs to follow up with Dr. Sherlean Foot in our office on Tuesday,  July 10.  He needs to call [phone] for that appointment.   LABORATORY DATA:  Chest x-ray done on January 24, 2006 showed mild COPD with  stable dual-lead pacer system, no cardiomegaly or edema seen.  X-ray taken  of the right hip after hip replacement showed the prosthesis in good  position with 2 cerclage wires around the greater trochanter of the proximal  right femur.   Hemoglobin/hematocrit ranged from 14.4 an 42.3 on June 19 to 8.8 and 26.2 on  the 27th to 10.9 and 31.5 on June 28.  Platelets dropped to a low of 123 on  the 27th and they are stable at 123 on the 28th.   Glucoses ranged   Dictation ended at this point.      Legrand Pitts Duffy, P.A.    ______________________________  Mila Homer. Sherlean Foot, M.D.    KED/MEDQ  D:  02/02/2006  T:  02/02/2006  Job:  [id number]

## 2011-01-13 ENCOUNTER — Encounter: Payer: Self-pay | Admitting: Internal Medicine

## 2011-04-08 ENCOUNTER — Encounter: Payer: Medicare Other | Admitting: *Deleted

## 2011-08-22 ENCOUNTER — Encounter: Payer: Self-pay | Admitting: *Deleted

## 2011-08-31 ENCOUNTER — Encounter: Payer: Self-pay | Admitting: Internal Medicine

## 2011-09-28 ENCOUNTER — Encounter: Payer: Self-pay | Admitting: Internal Medicine

## 2011-09-28 ENCOUNTER — Ambulatory Visit (INDEPENDENT_AMBULATORY_CARE_PROVIDER_SITE_OTHER): Payer: Medicare Other | Admitting: Internal Medicine

## 2011-09-28 DIAGNOSIS — Z950 Presence of cardiac pacemaker: Secondary | ICD-10-CM

## 2011-09-28 DIAGNOSIS — I495 Sick sinus syndrome: Secondary | ICD-10-CM

## 2011-09-28 DIAGNOSIS — R569 Unspecified convulsions: Secondary | ICD-10-CM

## 2011-09-28 DIAGNOSIS — IMO0001 Reserved for inherently not codable concepts without codable children: Secondary | ICD-10-CM | POA: Insufficient documentation

## 2011-09-28 LAB — PACEMAKER DEVICE OBSERVATION
AL AMPLITUDE: 3.7 mv
AL IMPEDENCE PM: 480 Ohm
BAMS-0001: 170 {beats}/min
RV LEAD AMPLITUDE: 6.3 mv
RV LEAD IMPEDENCE PM: 488 Ohm
RV LEAD THRESHOLD: 0.5 V

## 2011-09-28 NOTE — Patient Instructions (Signed)
Your physician has recommended you make the following change in your medication:  1) Stop amlodipine-benazepril (lotrel)  Your physician wants you to follow-up in: 6 months with Kristin/Paula for a device check & 1 year with Dr. Graciela Husbands. You will receive a reminder letter in the mail two months in advance. If you don't receive a letter, please call our office to schedule the follow-up appointment.

## 2011-09-28 NOTE — Progress Notes (Signed)
  HPI  [name] is a 78 y.o. female he seen in followup for sinus bradycardia and paroxysmal atrial arrhythmias. She's had some intermittent palpitations.  Exercise has been limited by discomfort in her hips.   issues recently has been memory. The patient denies SOB, chest pain, edema or palpitations Her episodes that she has that last 10-15 minutes and, abruptly in spurts where she becomes confused diaphoretic and flushed   Past Medical History  Diagnosis Date  . Hypertension   . Sinus node dysfunction   . Ventricular tachycardia, nonsustained   . Syncope and collapse   . Pacemaker -Medtronic   . Thyroid disease     hypothyroidism  . Degenerative joint disease     Past Surgical History  Procedure Date  . Insert / replace / remove pacemaker     Current Outpatient Prescriptions  Medication Sig Dispense Refill  . amLODipine-benazepril (LOTREL) 5-20 MG per capsule Take 1 capsule by mouth every other day.       . colestipol (COLESTID) 5 G granules Take 1 g by mouth daily.        Marland Kitchen levothyroxine (SYNTHROID, LEVOTHROID) 150 MCG tablet Take 150 mcg by mouth daily.        . metoprolol (LOPRESSOR) 50 MG tablet Take 50 mg by mouth 2 (two) times daily. Take 1/2 tablet by mouth  twice a day.        No Known Allergies  Review of Systems negative except from HPI and PMH  Physical Exam BP 126/84  Pulse 61  Resp 18  Ht 5\' 5"  (1.651 m)  Wt 183 lb 1.9 oz (83.063 kg)  BMI 30.47 kg/m2 Well developed and well nourished in no acute distress HENT normal E scleral and icterus clear Neck Supple JVP f2lat; carotids brisk and full Clear to ausculation Regular rate and rhythm, no murmurs gallops or rub Soft with active bowel sounds No clubbing cyanosis none Edema Alert and oriented short-term memory remains a major concern, grossly normal motor and sensory function Skin Warm and Dry   Assessment and  Plan

## 2011-09-28 NOTE — Assessment & Plan Note (Signed)
I'm not sure of the mechanism of these spells. They are relatively abrupt and brief raising the possibility of arrhythmia. She has had nonsustained atrial arrhythmias identified on her pacemaker. I instructed her daughter-in-law to try and take her pulse and her blood pressure during one of these events.

## 2011-09-28 NOTE — Assessment & Plan Note (Signed)
The patient's device was interrogated.  The information was reviewed. No changes were made in the programming.    

## 2011-09-28 NOTE — Assessment & Plan Note (Signed)
Atrial lead paced to 35% of the time

## 2012-04-05 ENCOUNTER — Ambulatory Visit (INDEPENDENT_AMBULATORY_CARE_PROVIDER_SITE_OTHER): Payer: Medicare Other | Admitting: *Deleted

## 2012-04-05 ENCOUNTER — Encounter: Payer: Self-pay | Admitting: Internal Medicine

## 2012-04-05 DIAGNOSIS — I495 Sick sinus syndrome: Secondary | ICD-10-CM

## 2012-04-05 LAB — PACEMAKER DEVICE OBSERVATION
AL IMPEDENCE PM: 464 Ohm
AL THRESHOLD: 0.5 V
ATRIAL PACING PM: 6.01
BATTERY VOLTAGE: 2.93 V
RV LEAD AMPLITUDE: 8.8 mv

## 2012-04-05 NOTE — Progress Notes (Signed)
Pacer check in clinic  

## 2012-04-06 ENCOUNTER — Encounter: Payer: Self-pay | Admitting: *Deleted

## 2012-04-06 ENCOUNTER — Encounter: Payer: Self-pay | Admitting: Cardiology

## 2012-04-06 ENCOUNTER — Ambulatory Visit (INDEPENDENT_AMBULATORY_CARE_PROVIDER_SITE_OTHER): Payer: Medicare Other | Admitting: Cardiology

## 2012-04-06 VITALS — BP 124/82 | HR 66 | Ht 63.0 in | Wt 189.9 lb

## 2012-04-06 DIAGNOSIS — Z45018 Encounter for adjustment and management of other part of cardiac pacemaker: Secondary | ICD-10-CM

## 2012-04-06 DIAGNOSIS — I495 Sick sinus syndrome: Secondary | ICD-10-CM

## 2012-04-06 DIAGNOSIS — Z950 Presence of cardiac pacemaker: Secondary | ICD-10-CM

## 2012-04-06 DIAGNOSIS — Z45010 Encounter for checking and testing of cardiac pacemaker pulse generator [battery]: Secondary | ICD-10-CM

## 2012-04-06 NOTE — Progress Notes (Signed)
 ELECTROPHYSIOLOGY OFFICE NOTE  Patient ID: [name] MRN: [id number], DOB/AGE: 09/29/1928   Date of Visit: 04/06/2012  Primary Physician: CAMERON,JOHN, MD Primary Cardiologist: Steve Klein, MD Reason for Visit: Device follow-up; PPM battery at EOL  History of Present Illness [name] is a pleasant 78 year old woman with SND s/p PPM and HTN who presents today for device follow-up after her PPM battery was found to be at EOL at a recent device clinic visit. She reports she is feeling well and has no complaints. She denies CP, SOB, palpitations, dizziness, near syncope or syncope.  Past Medical History  Diagnosis Date  . Hypertension   . Sinus node dysfunction   . Ventricular tachycardia, nonsustained   . Syncope and collapse   . Pacemaker -Medtronic   . Thyroid disease     hypothyroidism  . Degenerative joint disease     Past Surgical History  Procedure Date  . Insert / replace / remove pacemaker      Allergies/Intolerances Allergies  Allergen Reactions  . Latex     Current Home Medications Current Outpatient Prescriptions  Medication Sig Dispense Refill  . colestipol (COLESTID) 5 G granules Take 1 g by mouth daily.        . famotidine (PEPCID) 10 MG tablet Take 10 mg by mouth daily.      . levothyroxine (SYNTHROID, LEVOTHROID) 150 MCG tablet Take 150 mcg by mouth daily.        . memantine (NAMENDA TITRATION PACK) tablet pack Take by mouth See admin instructions. 28 mg daily      . metoprolol (LOPRESSOR) 50 MG tablet Take 50 mg by mouth 2 (two) times daily. Take 1/2 tablet by mouth  twice a day.        Social History Social History  . Marital Status: Married   Social History Main Topics  . Smoking status: Never Smoker   . Smokeless tobacco: No  . Alcohol Use: No  . Drug Use: No   Review of Systems General: No chills, fever, night sweats or weight changes Cardiovascular: No chest pain, dyspnea on exertion, edema, orthopnea, palpitations, paroxysmal  nocturnal dyspnea Dermatological: No rash, lesions or masses Respiratory: No cough, dyspnea Urologic: No hematuria, dysuria Abdominal: No nausea, vomiting, diarrhea, bright red blood per rectum, melena, or hematemesis Neurologic: No visual changes, weakness, changes in mental status All other systems reviewed and are otherwise negative except as noted above.  Physical Exam Blood pressure 124/82, pulse 66, height 5' 3" (1.6 m), weight 189 lb 14.4 oz (86.138 kg).  General: Well developed, well appearing 78 year old female in no acute distress. HEENT: Normocephalic, atraumatic. EOMs intact. Sclera nonicteric. Oropharynx clear.  Neck: Supple without bruits. No JVD. Lungs:  Respirations regular and unlabored, CTA bilaterally. No wheezes, rales or rhonchi. Heart: RRR. S1, S2 present. No murmurs, rub, S3 or S4. Abdomen: Soft, non-distended. Extremities: No clubbing, cyanosis or edema. DP/PT/Radials 2+ and equal bilaterally. Psych: Normal affect. Neuro: Alert and oriented X 3. Moves all extremities spontaneously.   Diagnostics 12-lead ECG shows A paced V sensed rhythm at 67 bpm Device interrogation not performed during today's visit as her PPM battery is at EOL; device interrogation done yesterday has been reviewed  Assessment and Plan 1. Sinus node dysfunction s/p PPM - battery is at EOL; discussed the need for generator change with [name] and her daughter; reviewed the procedure in detail, including risks and benefits; these risks include, but are not limited to, bleeding, lead dislodgement and   infection; [name] and her daughter expressed verbal understanding, stated all their questions were answered to their satisfaction and agree to proceed with PPM pulse generator change. This will be scheduled in the next 2 weeks with Dr. Klein.   Signed, Ayesha Markwell, PA-C 04/06/2012, 3:32 PM   

## 2012-04-06 NOTE — Patient Instructions (Signed)
See instruction sheet for pacemaker generator change.  

## 2012-04-19 ENCOUNTER — Ambulatory Visit (INDEPENDENT_AMBULATORY_CARE_PROVIDER_SITE_OTHER): Payer: Medicare Other | Admitting: *Deleted

## 2012-04-19 DIAGNOSIS — I495 Sick sinus syndrome: Secondary | ICD-10-CM

## 2012-04-19 DIAGNOSIS — Z01812 Encounter for preprocedural laboratory examination: Secondary | ICD-10-CM

## 2012-04-19 LAB — BASIC METABOLIC PANEL
BUN: 18 mg/dL (ref 6–23)
CO2: 23 mEq/L (ref 19–32)
Calcium: 9.2 mg/dL (ref 8.4–10.5)
Chloride: 107 mEq/L (ref 96–112)
GFR: 51.7 mL/min — ABNORMAL LOW (ref >60.00)
Potassium: 4.3 mEq/L (ref 3.5–5.1)

## 2012-04-19 LAB — CBC WITH DIFFERENTIAL/PLATELET
Lymphs Abs: 0.7 10*3/uL (ref 0.7–4.0)
Monocytes Absolute: 0.5 10*3/uL (ref 0.1–1.0)
Monocytes Relative: 6 % (ref 3.0–12.0)
RDW: 15.1 % — ABNORMAL HIGH (ref 11.5–14.6)

## 2012-04-25 MED ORDER — CEFAZOLIN SODIUM-DEXTROSE 2-3 GM-% IV SOLR
2.0000 g | INTRAVENOUS | Status: AC
  Filled 2012-04-25: qty 50

## 2012-04-25 MED ORDER — SODIUM CHLORIDE 0.9 % IR SOLN
80.0000 mg | Status: AC
  Filled 2012-04-25: qty 2

## 2012-04-26 ENCOUNTER — Ambulatory Visit (HOSPITAL_COMMUNITY)
Admission: RE | Admit: 2012-04-26 | Discharge: 2012-04-26 | Disposition: A | Discharge status: Home / Self Care | Payer: Medicare Other | Source: Ambulatory Visit | Attending: Internal Medicine | Admitting: Internal Medicine

## 2012-04-26 ENCOUNTER — Encounter (HOSPITAL_COMMUNITY)
Admission: RE | Disposition: A | Discharge status: Home / Self Care | Payer: Self-pay | Source: Ambulatory Visit | Attending: Internal Medicine

## 2012-04-26 ENCOUNTER — Ambulatory Visit (HOSPITAL_COMMUNITY): Payer: Medicare Other

## 2012-04-26 DIAGNOSIS — Z45018 Encounter for adjustment and management of other part of cardiac pacemaker: Secondary | ICD-10-CM | POA: Insufficient documentation

## 2012-04-26 DIAGNOSIS — I10 Essential (primary) hypertension: Secondary | ICD-10-CM | POA: Insufficient documentation

## 2012-04-26 DIAGNOSIS — E039 Hypothyroidism, unspecified: Secondary | ICD-10-CM | POA: Insufficient documentation

## 2012-04-26 DIAGNOSIS — I495 Sick sinus syndrome: Secondary | ICD-10-CM | POA: Insufficient documentation

## 2012-04-26 DIAGNOSIS — I498 Other specified cardiac arrhythmias: Secondary | ICD-10-CM

## 2012-04-26 HISTORY — PX: PERMANENT PACEMAKER GENERATOR CHANGE: SHX6022

## 2012-04-26 LAB — SURGICAL PCR SCREEN: Staphylococcus aureus: POSITIVE — AB

## 2012-04-26 SURGERY — PERMANENT PACEMAKER GENERATOR CHANGE
Anesthesia: LOCAL

## 2012-04-26 MED ORDER — MIDAZOLAM HCL 5 MG/5ML IJ SOLN
INTRAMUSCULAR | Status: AC
  Filled 2012-04-26: qty 5

## 2012-04-26 MED ORDER — CHLORHEXIDINE GLUCONATE 4 % EX LIQD
60.0000 mL | Freq: Once | CUTANEOUS | Status: DC
  Filled 2012-04-26: qty 60

## 2012-04-26 MED ORDER — MUPIROCIN 2 % EX OINT
TOPICAL_OINTMENT | Freq: Two times a day (BID) | CUTANEOUS | Status: DC
  Filled 2012-04-26: qty 22

## 2012-04-26 MED ORDER — MUPIROCIN 2 % EX OINT
TOPICAL_OINTMENT | CUTANEOUS | Status: AC
  Administered 2012-04-26: 1 via NASAL
  Filled 2012-04-26: qty 22

## 2012-04-26 MED ORDER — FENTANYL CITRATE 0.05 MG/ML IJ SOLN
INTRAMUSCULAR | Status: AC
  Filled 2012-04-26: qty 2

## 2012-04-26 MED ORDER — LABETALOL HCL 5 MG/ML IV SOLN
20.0000 mg | Freq: Once | INTRAVENOUS | Status: AC
  Administered 2012-04-26: 20 mg via INTRAVENOUS

## 2012-04-26 MED ORDER — LIDOCAINE HCL (PF) 1 % IJ SOLN
INTRAMUSCULAR | Status: AC
  Filled 2012-04-26: qty 60

## 2012-04-26 MED ORDER — SODIUM CHLORIDE 0.45 % IV SOLN
INTRAVENOUS | Status: DC
  Administered 2012-04-26: 09:00:00 via INTRAVENOUS

## 2012-04-26 MED ORDER — LABETALOL HCL 5 MG/ML IV SOLN
INTRAVENOUS | Status: AC
  Filled 2012-04-26: qty 4

## 2012-04-26 MED ORDER — CEFAZOLIN SODIUM-DEXTROSE 2-3 GM-% IV SOLR
INTRAVENOUS | Status: AC
  Filled 2012-04-26: qty 50

## 2012-04-26 NOTE — CV Procedure (Signed)
Preoperative diagnosis  Bradycardia prev pacer now at [facility] Postoperative diagnosis same/   Procedure: Generator replacement    Following informed consent the patient was brought to the electrophysiology laboratory in place of the fluoroscopic table in the supine position after routine prep and drape lidocaine was infiltrated in the region of the previous incision and carried down to later the device pocket using sharp dissection and electrocautery. The pocket was opened the device was freed up and was explanted.  Interrogation of the previously implanted ventricular lead  Medtronic   demonstrated an R wave of 10.2  millivolts., and impedance of 486ohms, and a pacing threshold of 1 volts at 0.5  msec.    The previously implanted atrial lead   added P-wave amplitude of 5.0 illlivolts  and impedance of  497 ohms, and a pacing threshold of 1volts at 0.58milliseconds.  The leads were inspected. The leads were then attached to a Medtronic  pulse generator, serial number [id number] .    The pocket was irrigated with antibiotic containing saline solution hemostasis was assured and the leads and the device were placed in the pocket. The wound is then closed in 3 layers in normal fashion.  The patient tolerated the procedure without apparent complication.  Sherryl Manges

## 2012-04-26 NOTE — H&P (View-Only) (Signed)
ELECTROPHYSIOLOGY OFFICE NOTE  Patient ID: [name] MRN: [id number], DOB/AGE: June 02, 1929   Date of Visit: 04/06/2012  Primary Physician: Desmond Dike, MD Primary Cardiologist: Berton Mount, MD Reason for Visit: Device follow-up; PPM battery at EOL  History of Present Illness [name] is a pleasant 78 year old woman with SND s/p PPM and HTN who presents today for device follow-up after her PPM battery was found to be at EOL at a recent device clinic visit. She reports she is feeling well and has no complaints. She denies CP, SOB, palpitations, dizziness, near syncope or syncope.  Past Medical History  Diagnosis Date  . Hypertension   . Sinus node dysfunction   . Ventricular tachycardia, nonsustained   . Syncope and collapse   . Pacemaker -Medtronic   . Thyroid disease     hypothyroidism  . Degenerative joint disease     Past Surgical History  Procedure Date  . Insert / replace / remove pacemaker      Allergies/Intolerances Allergies  Allergen Reactions  . Latex     Current Home Medications Current Outpatient Prescriptions  Medication Sig Dispense Refill  . colestipol (COLESTID) 5 G granules Take 1 g by mouth daily.        . famotidine (PEPCID) 10 MG tablet Take 10 mg by mouth daily.      Marland Kitchen levothyroxine (SYNTHROID, LEVOTHROID) 150 MCG tablet Take 150 mcg by mouth daily.        . memantine (NAMENDA TITRATION PACK) tablet pack Take by mouth See admin instructions. 28 mg daily      . metoprolol (LOPRESSOR) 50 MG tablet Take 50 mg by mouth 2 (two) times daily. Take 1/2 tablet by mouth  twice a day.        Social History Social History  . Marital Status: Married   Social History Main Topics  . Smoking status: Never Smoker   . Smokeless tobacco: No  . Alcohol Use: No  . Drug Use: No   Review of Systems General: No chills, fever, night sweats or weight changes Cardiovascular: No chest pain, dyspnea on exertion, edema, orthopnea, palpitations, paroxysmal  nocturnal dyspnea Dermatological: No rash, lesions or masses Respiratory: No cough, dyspnea Urologic: No hematuria, dysuria Abdominal: No nausea, vomiting, diarrhea, bright red blood per rectum, melena, or hematemesis Neurologic: No visual changes, weakness, changes in mental status All other systems reviewed and are otherwise negative except as noted above.  Physical Exam Blood pressure 124/82, pulse 66, height 5\' 3"  (1.6 m), weight 189 lb 14.4 oz (86.138 kg).  General: Well developed, well appearing 78 year old female in no acute distress. HEENT: Normocephalic, atraumatic. EOMs intact. Sclera nonicteric. Oropharynx clear.  Neck: Supple without bruits. No JVD. Lungs:  Respirations regular and unlabored, CTA bilaterally. No wheezes, rales or rhonchi. Heart: RRR. S1, S2 present. No murmurs, rub, S3 or S4. Abdomen: Soft, non-distended. Extremities: No clubbing, cyanosis or edema. DP/PT/Radials 2+ and equal bilaterally. Psych: Normal affect. Neuro: Alert and oriented X 3. Moves all extremities spontaneously.   Diagnostics 12-lead ECG shows A paced V sensed rhythm at 67 bpm Device interrogation not performed during today's visit as her PPM battery is at EOL; device interrogation done yesterday has been reviewed  Assessment and Plan 1. Sinus node dysfunction s/p PPM - battery is at EOL; discussed the need for generator change with [name] and her daughter; reviewed the procedure in detail, including risks and benefits; these risks include, but are not limited to, bleeding, lead dislodgement and  infection; [name] and her daughter expressed verbal understanding, stated all their questions were answered to their satisfaction and agree to proceed with PPM pulse generator change. This will be scheduled in the next 2 weeks with Dr. Graciela Husbands.   Signed, Rick Duff, PA-C 04/06/2012, 3:32 PM

## 2012-04-26 NOTE — Interval H&P Note (Signed)
History and Physical Interval Note:  04/26/2012 12:27 PM  [name]  has presented today for surgery, with the diagnosis of End of life  The various methods of treatment have been discussed with the patient and family. After consideration of risks, benefits and other options for treatment, the patient has consented to  Procedure(s) (LRB) with comments: PERMANENT PACEMAKER GENERATOR CHANGE (N/A) as a surgical intervention .  The patient's history has been reviewed, patient examined, no change in status, stable for surgery.  I have reviewed the patient's chart and labs.  Questions were answered to the patient's satisfaction.     Sherryl Manges

## 2012-05-09 ENCOUNTER — Ambulatory Visit (INDEPENDENT_AMBULATORY_CARE_PROVIDER_SITE_OTHER): Payer: Medicare Other | Admitting: *Deleted

## 2012-05-09 ENCOUNTER — Encounter: Payer: Self-pay | Admitting: Internal Medicine

## 2012-05-09 DIAGNOSIS — I495 Sick sinus syndrome: Secondary | ICD-10-CM

## 2012-05-09 LAB — PACEMAKER DEVICE OBSERVATION
BAMS-0001: 150 {beats}/min
BATTERY VOLTAGE: 2.8 V
DEVICE MODEL PM: 245242
RV LEAD AMPLITUDE: 8 mv
RV LEAD THRESHOLD: 0.5 V
VENTRICULAR PACING PM: 0

## 2012-05-09 NOTE — Progress Notes (Signed)
Wound check-PPM 

## 2012-08-10 ENCOUNTER — Telehealth: Payer: Self-pay | Admitting: Internal Medicine

## 2012-08-10 NOTE — Telephone Encounter (Signed)
Spoke w/pt and pt not sure if received transmitter. Will have to talk with pt in regards to this. Pt requested a call back next week. Not sure when pt will talk with daughter.

## 2012-08-10 NOTE — Telephone Encounter (Signed)
Called pt re remote for Monday, said she doesn't have a transmitter, was one ordered for her? Pls call

## 2012-08-13 ENCOUNTER — Encounter: Payer: Medicare Other | Admitting: *Deleted

## 2012-08-13 NOTE — Telephone Encounter (Signed)
Patient will send transmission in a couple of days when her daughter is able to help her.

## 2012-08-16 ENCOUNTER — Encounter: Payer: Self-pay | Admitting: *Deleted

## 2012-08-20 ENCOUNTER — Ambulatory Visit (INDEPENDENT_AMBULATORY_CARE_PROVIDER_SITE_OTHER): Payer: Medicare Other | Admitting: *Deleted

## 2012-08-20 DIAGNOSIS — I495 Sick sinus syndrome: Secondary | ICD-10-CM

## 2012-08-20 DIAGNOSIS — Z950 Presence of cardiac pacemaker: Secondary | ICD-10-CM

## 2012-08-20 LAB — REMOTE PACEMAKER DEVICE
AL AMPLITUDE: 5.6 mv
AL IMPEDENCE PM: 433 Ohm
ATRIAL PACING PM: 24.9
DEVICE MODEL PM: 245242
RV LEAD IMPEDENCE PM: 526 Ohm
RV LEAD THRESHOLD: 0.625 V
VENTRICULAR PACING PM: 0.3

## 2012-09-03 ENCOUNTER — Encounter: Payer: Self-pay | Admitting: *Deleted

## 2012-09-07 ENCOUNTER — Encounter: Payer: Self-pay | Admitting: Internal Medicine

## 2012-11-19 ENCOUNTER — Encounter: Payer: Medicare Other | Admitting: *Deleted

## 2012-11-28 ENCOUNTER — Encounter: Payer: Self-pay | Admitting: *Deleted

## 2013-01-30 ENCOUNTER — Telehealth: Payer: Self-pay | Admitting: Internal Medicine

## 2013-01-30 NOTE — Telephone Encounter (Signed)
01-30-13 called pt and asked her to send remote transmission, she states she does not know how to do it and will send one in as soon as she can get someone to help her/mt

## 2013-03-06 IMAGING — CR DG CHEST 2V
2 series · 2 of 2 positions shown · non-contrast
Comparison: 01/24/2006

CLINICAL DATA: Pre-procedure pacemaker change.

CHEST - 2 VIEW

[view not recorded (1 of 2)]
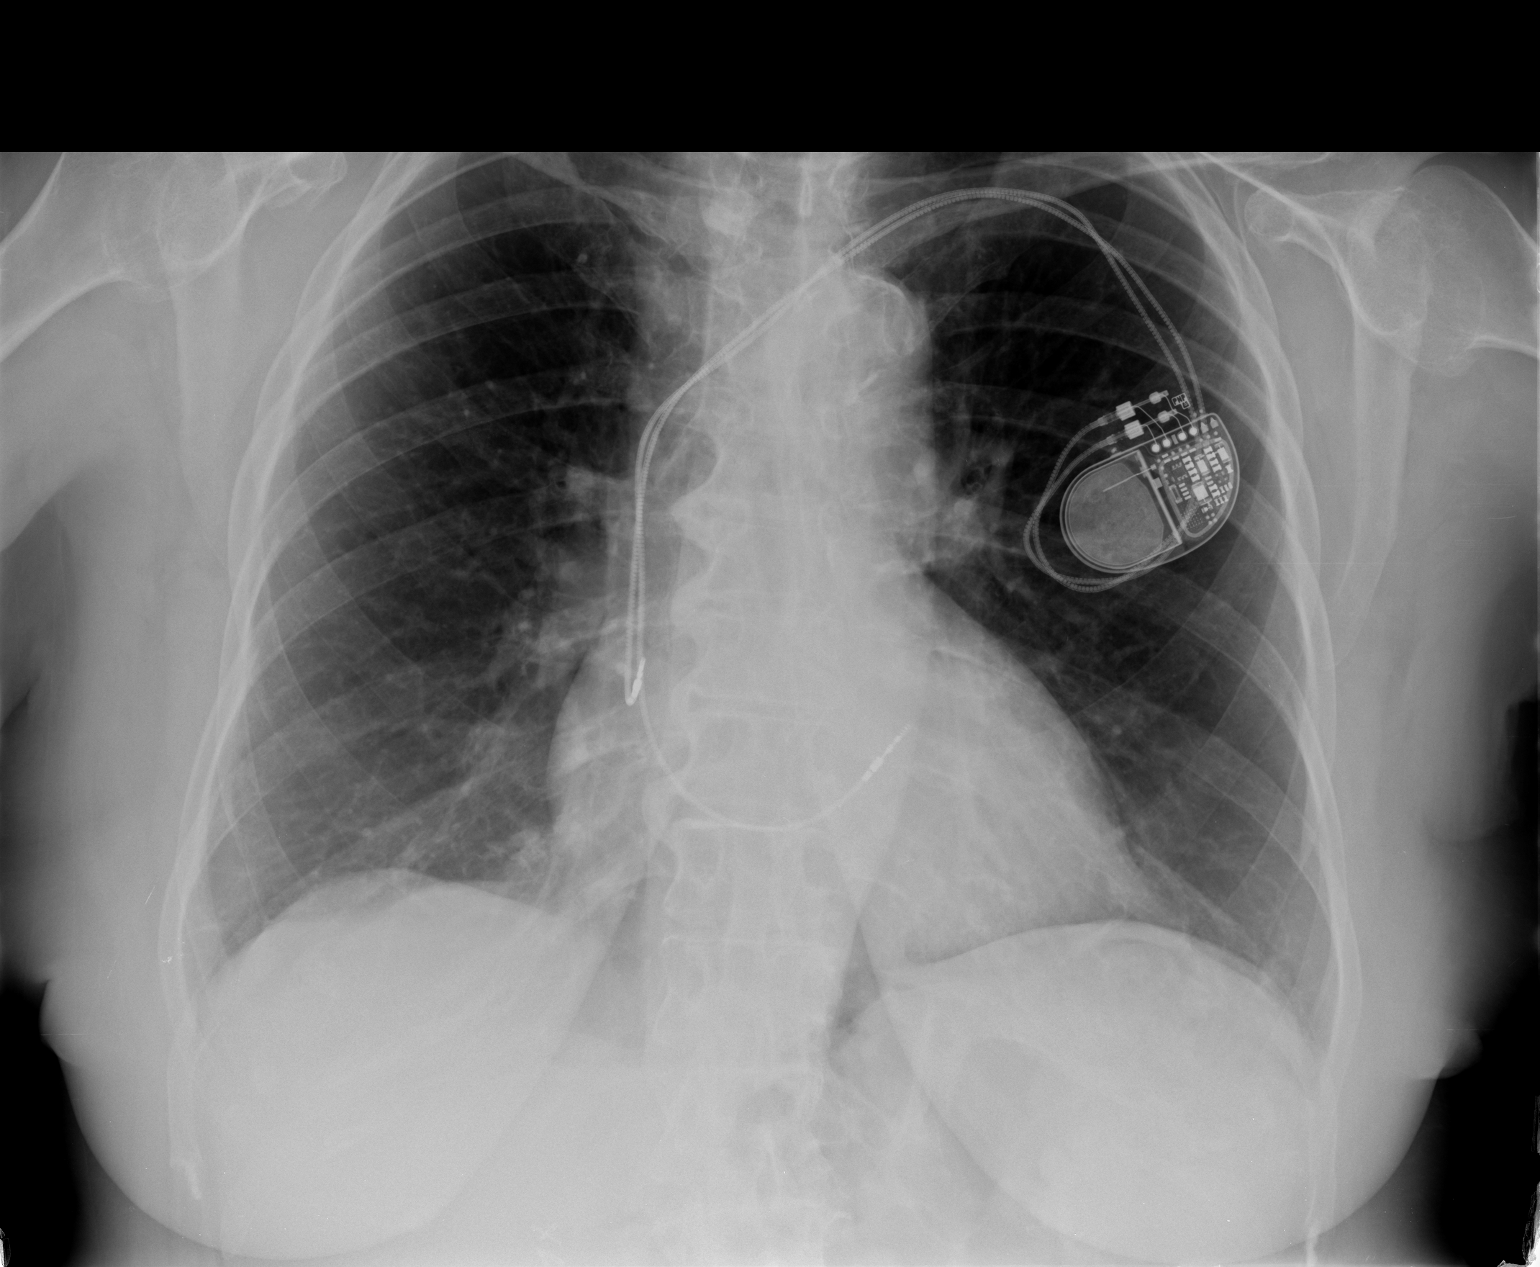

[view not recorded (2 of 2)]
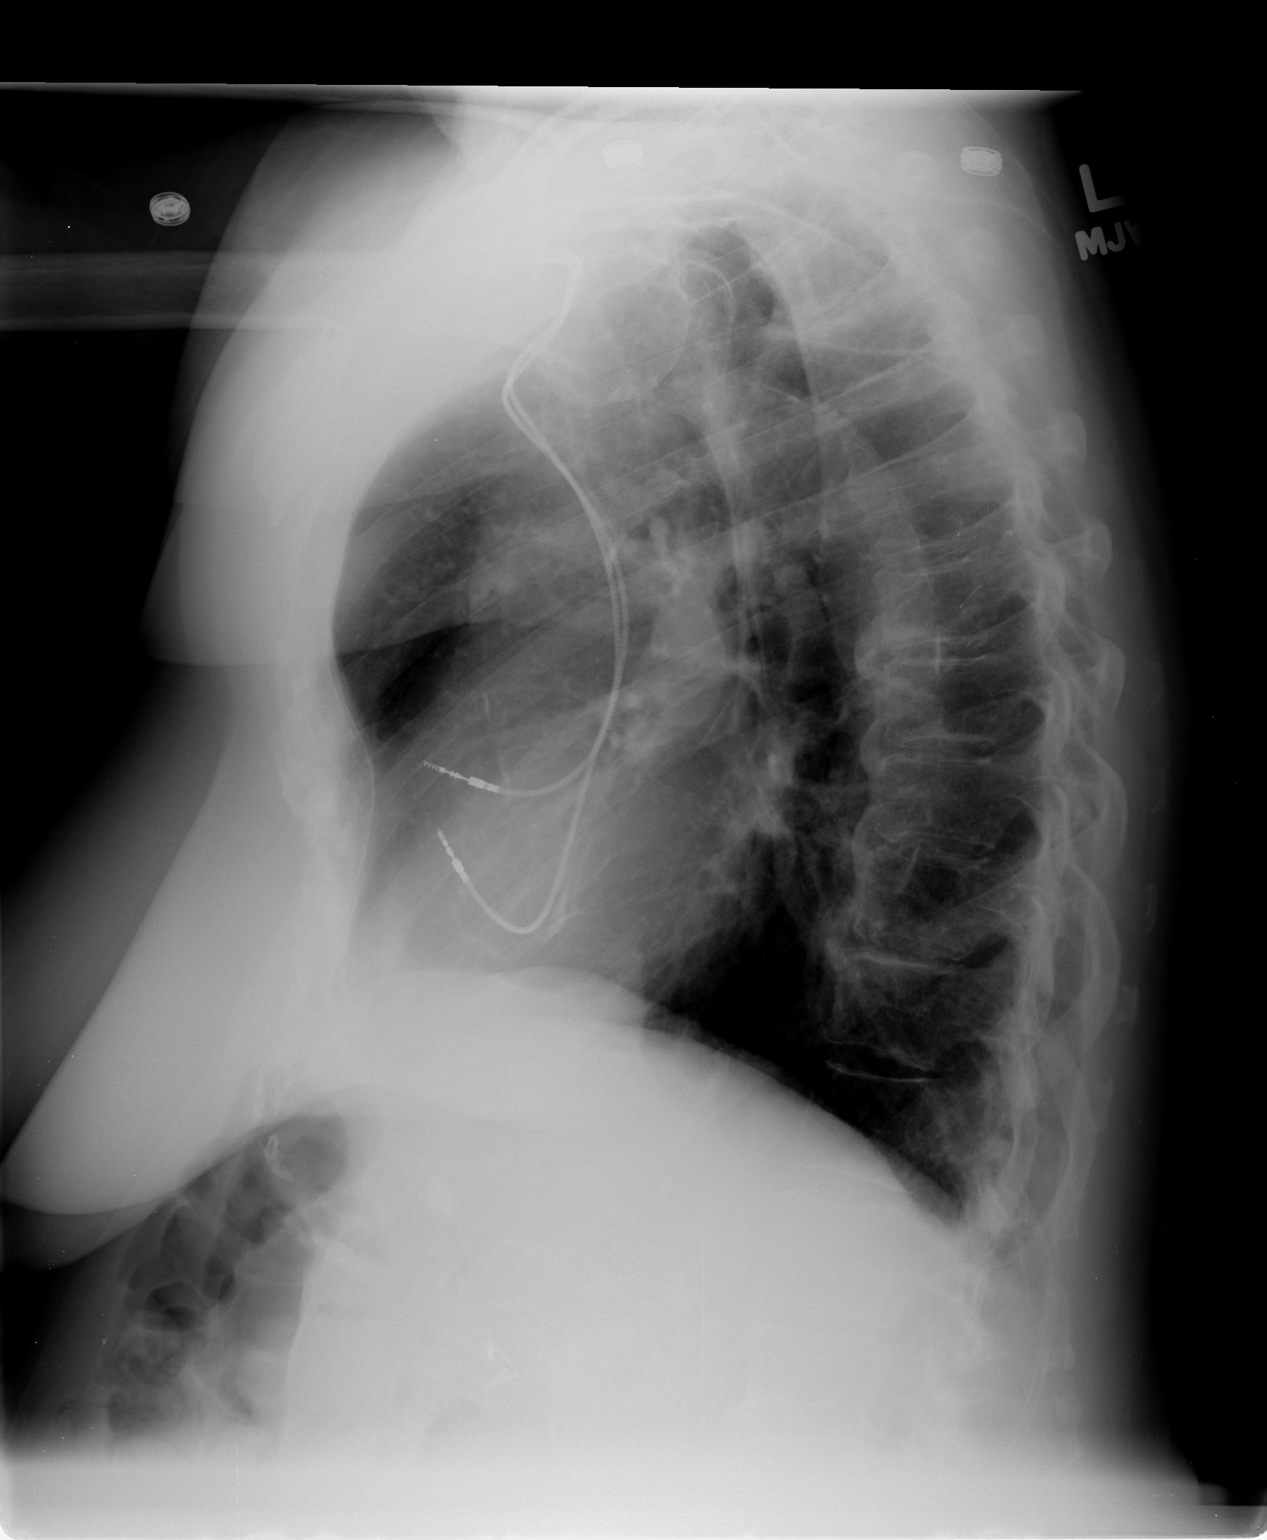

[2 of 2 positions shown; findings below may reference images not displayed]

FINDINGS: Left chest wall battery pack with lead tips projecting
over the right atrium and right ventricle, unchanged. Heart size
upper normal.  No focal consolidation, pleural effusion, or
pneumothorax.  Multilevel degenerative changes with flowing
anterior osteophytes.  Surgical clips right upper quadrant.
IMPRESSION: No radiographic evidence of acute cardiopulmonary process.

## 2013-03-08 ENCOUNTER — Telehealth: Payer: Self-pay | Admitting: Internal Medicine

## 2013-03-08 ENCOUNTER — Encounter: Payer: Self-pay | Admitting: Internal Medicine

## 2013-03-08 NOTE — Telephone Encounter (Signed)
81-14 sent past due letter, pt states she sent remote in June, did not receive it , talked to her 01-30-13 she was to send another one have not yet received anything yet./mt

## 2013-05-14 ENCOUNTER — Encounter: Payer: Self-pay | Admitting: *Deleted

## 2013-08-14 ENCOUNTER — Encounter: Payer: Medicare Other | Admitting: Internal Medicine

## 2013-08-21 ENCOUNTER — Inpatient Hospital Stay (HOSPITAL_COMMUNITY)
Admission: EM | Admit: 2013-08-21 | Discharge: 2013-08-24 | DRG: 176 | Disposition: A | Discharge status: Skilled Nursing Facility | Payer: Medicare Other | Source: Other Acute Inpatient Hospital | Attending: Critical Care Medicine | Admitting: Critical Care Medicine

## 2013-08-21 DIAGNOSIS — Z91040 Latex allergy status: Secondary | ICD-10-CM

## 2013-08-21 DIAGNOSIS — F0390 Unspecified dementia without behavioral disturbance: Secondary | ICD-10-CM | POA: Diagnosis present

## 2013-08-21 DIAGNOSIS — R627 Adult failure to thrive: Secondary | ICD-10-CM | POA: Diagnosis present

## 2013-08-21 DIAGNOSIS — E039 Hypothyroidism, unspecified: Secondary | ICD-10-CM | POA: Diagnosis present

## 2013-08-21 DIAGNOSIS — I10 Essential (primary) hypertension: Secondary | ICD-10-CM | POA: Diagnosis present

## 2013-08-21 DIAGNOSIS — K219 Gastro-esophageal reflux disease without esophagitis: Secondary | ICD-10-CM | POA: Diagnosis present

## 2013-08-21 DIAGNOSIS — Z6836 Body mass index (BMI) 36.0-36.9, adult: Secondary | ICD-10-CM

## 2013-08-21 DIAGNOSIS — E669 Obesity, unspecified: Secondary | ICD-10-CM | POA: Diagnosis present

## 2013-08-21 DIAGNOSIS — I2699 Other pulmonary embolism without acute cor pulmonale: Principal | ICD-10-CM | POA: Diagnosis present

## 2013-08-21 DIAGNOSIS — Z950 Presence of cardiac pacemaker: Secondary | ICD-10-CM

## 2013-08-21 DIAGNOSIS — M1990 Unspecified osteoarthritis, unspecified site: Secondary | ICD-10-CM | POA: Diagnosis present

## 2013-08-21 DIAGNOSIS — I495 Sick sinus syndrome: Secondary | ICD-10-CM | POA: Diagnosis present

## 2013-08-21 DIAGNOSIS — Z79899 Other long term (current) drug therapy: Secondary | ICD-10-CM

## 2013-08-21 DIAGNOSIS — R52 Pain, unspecified: Secondary | ICD-10-CM | POA: Diagnosis present

## 2013-08-21 DIAGNOSIS — Z87891 Personal history of nicotine dependence: Secondary | ICD-10-CM

## 2013-08-21 DIAGNOSIS — F29 Unspecified psychosis not due to a substance or known physiological condition: Secondary | ICD-10-CM | POA: Diagnosis present

## 2013-08-21 DIAGNOSIS — Z7901 Long term (current) use of anticoagulants: Secondary | ICD-10-CM

## 2013-08-21 DIAGNOSIS — J9601 Acute respiratory failure with hypoxia: Secondary | ICD-10-CM

## 2013-08-21 LAB — MRSA PCR SCREENING: MRSA by PCR: NEGATIVE

## 2013-08-21 MED ORDER — INFLUENZA VAC SPLIT QUAD 0.5 ML IM SUSP
0.5000 mL | INTRAMUSCULAR | Status: DC
  Filled 2013-08-21: qty 0.5

## 2013-08-21 MED ORDER — FAMOTIDINE 10 MG PO TABS
10.0000 mg | ORAL_TABLET | Freq: Every day | ORAL | Status: DC
  Administered 2013-08-22 – 2013-08-24 (×3): 10 mg via ORAL
  Filled 2013-08-21 (×3): qty 1

## 2013-08-21 MED ORDER — PNEUMOCOCCAL VAC POLYVALENT 25 MCG/0.5ML IJ INJ
0.5000 mL | INJECTION | INTRAMUSCULAR | Status: DC
  Filled 2013-08-21: qty 0.5

## 2013-08-21 MED ORDER — MORPHINE SULFATE 2 MG/ML IJ SOLN
1.0000 mg | INTRAMUSCULAR | Status: DC | PRN
  Administered 2013-08-22: 2 mg via INTRAVENOUS
  Administered 2013-08-22 (×2): 1 mg via INTRAVENOUS
  Administered 2013-08-22 – 2013-08-23 (×2): 2 mg via INTRAVENOUS
  Filled 2013-08-21 (×5): qty 1

## 2013-08-21 MED ORDER — SODIUM CHLORIDE 0.9 % IV SOLN: 250.0000 mL | INTRAVENOUS | Status: DC | PRN

## 2013-08-21 MED ORDER — MEMANTINE HCL ER 28 MG PO CP24
28.0000 mg | ORAL_CAPSULE | Freq: Every day | ORAL | Status: DC
  Administered 2013-08-22 – 2013-08-24 (×3): 28 mg via ORAL
  Filled 2013-08-21 (×4): qty 28

## 2013-08-21 MED ORDER — HEPARIN (PORCINE) IN NACL 100-0.45 UNIT/ML-% IJ SOLN
1700.0000 [IU]/h | INTRAMUSCULAR | Status: AC
  Administered 2013-08-21: 1300 [IU]/h via INTRAVENOUS
  Administered 2013-08-22 – 2013-08-23 (×2): 1500 [IU]/h via INTRAVENOUS
  Administered 2013-08-24: 1700 [IU]/h via INTRAVENOUS
  Filled 2013-08-21 (×4): qty 250

## 2013-08-21 MED ORDER — ACETAMINOPHEN 325 MG PO TABS: 650.0000 mg | ORAL_TABLET | ORAL | Status: DC | PRN

## 2013-08-21 MED ORDER — ASPIRIN 300 MG RE SUPP
300.0000 mg | RECTAL | Status: AC
  Filled 2013-08-21: qty 1

## 2013-08-21 MED ORDER — METOPROLOL TARTRATE 25 MG PO TABS
25.0000 mg | ORAL_TABLET | Freq: Two times a day (BID) | ORAL | Status: DC
  Administered 2013-08-22 – 2013-08-24 (×6): 25 mg via ORAL
  Filled 2013-08-21 (×7): qty 1

## 2013-08-21 MED ORDER — LEVOTHYROXINE SODIUM 112 MCG PO TABS
112.0000 ug | ORAL_TABLET | Freq: Every day | ORAL | Status: DC
  Administered 2013-08-22 – 2013-08-24 (×3): 112 ug via ORAL
  Filled 2013-08-21 (×4): qty 1

## 2013-08-21 MED ORDER — ASPIRIN 81 MG PO CHEW
324.0000 mg | CHEWABLE_TABLET | ORAL | Status: AC
  Administered 2013-08-21: 324 mg via ORAL
  Filled 2013-08-21: qty 4

## 2013-08-21 MED ORDER — SODIUM CHLORIDE 0.9 % IV SOLN
INTRAVENOUS | Status: DC
  Administered 2013-08-21: 22:00:00 via INTRAVENOUS
  Administered 2013-08-22: 1000 mL via INTRAVENOUS

## 2013-08-21 NOTE — H&P (Signed)
Name: [name] MRN: [id number] DOB: 1929-01-06    ADMISSION DATE:  (Not on file)  REFERRING MD :  [facility] PRIMARY SERVICE: PCCM  CHIEF COMPLAINT:  PE  BRIEF PATIENT DESCRIPTION: 80 y/o F admitted 1/14 with PE.    SIGNIFICANT EVENTS / STUDIES:  1/14 - Admit with PE.  CT Chest>>positive for PE with RV strain, RML opacity concerning for infarct vs hemorrhage  LINES / TUBES: PIV  CULTURES:   ANTIBIOTICS:   HISTORY OF PRESENT ILLNESS:  80 y/o F, former smoker, with PMH of HTN, VT, Sinus node dysfunction s/p pacemaker, Syncope, Hypothyroidism, DJD and dementia who [facility]/14 with 24 hour complaint of right sided chest pain.  Daughter reports patient began complaining of chest pain after dinner on 1/13, was given tylenol per caretaker and had some improvement.  She woke am of admit with ongoing chest pain.  [facility] and transferred to the ER for further testing.  Patient was worked up for gallbladder, cardiac source of chest pain.  She continued to have pain and was medicated with morphine in ER.  Further had CTA Chest with findings positive for PE with RV strain, RML opacity concerning for infarct vs hemorrhage .  [facility].    At baseline, daughter reports an "almost weekly decline" in patients status in regards to mobility and cognitive status over the past 6 months.  She predominately sits in a chair and has gained >30lbs over the past 6 mo.  Patients husband passed in 2008.  She no longer drives and has assistance with ADL's during day at home / sitter at night.    Currently, she complains of intermittent chest pain and mild shortness of breath.  She is pleasantly demented with questioning. Denies other complaints, denies calf tenderness.   PAST MEDICAL HISTORY :  Past Medical History  Diagnosis Date  . Hypertension   . Sinus node dysfunction   . Ventricular  tachycardia, nonsustained   . Syncope and collapse   . Pacemaker -Medtronic   . Thyroid disease     hypothyroidism  . Degenerative joint disease    Past Surgical History  Procedure Laterality Date  . Insert / replace / remove pacemaker     Prior to Admission medications   Medication Sig Start Date End Date Taking? Authorizing Provider  colestipol (COLESTID) 5 G granules Take 1 g by mouth 3 (three) times a week.     Historical Provider, MD  famotidine (PEPCID) 10 MG tablet Take 10 mg by mouth daily.    Historical Provider, MD  levothyroxine (SYNTHROID, LEVOTHROID) 150 MCG tablet Take 112 mcg by mouth daily.     Historical Provider, MD  memantine (NAMENDA) 10 MG tablet Take 10 mg by mouth 2 (two) times daily.    Historical Provider, MD  metoprolol (LOPRESSOR) 50 MG tablet Take 50 mg by mouth 2 (two) times daily. Take 1/2 tablet by mouth  twice a day.    Historical Provider, MD   Allergies  Allergen Reactions  . Adhesive [Tape]   . Latex     FAMILY HISTORY:  No family history on file. SOCIAL HISTORY:  reports that she has never smoked. She does not have any smokeless tobacco history on file. Her alcohol and drug histories are not on file.  REVIEW OF SYSTEMS:  See HPI for pertinent positives  SUBJECTIVE:   VITAL SIGNS:    HEMODYNAMICS:  VENTILATOR SETTINGS:    INTAKE / OUTPUT: Intake/Output   None     PHYSICAL EXAMINATION: General:  Elderly female in NAD Neuro:  Pleasantly confused, speech clear, MAE HEENT:  Mm pink/moist, no jvd Cardiovascular:  s1s2 rrr, no m/r/g Lungs:  resp's even/non-labored, lungs bilaterally mildly diminished  Abdomen:  Round/soft, bsx4 active Musculoskeletal:  No acute deformities Skin:  Warm/dry, mild R>L edema in LE  LABS:  CBC No results found for this basename: WBC, HGB, HCT, PLT,  in the last 168 hours Coag's No results found for this basename: APTT, INR,  in the last 168 hours BMET No results found for this basename: NA, K,  CL, CO2, BUN, CREATININE, GLUCOSE,  in the last 168 hours Electrolytes No results found for this basename: CALCIUM, MG, PHOS,  in the last 168 hours Sepsis Markers No results found for this basename: LATICACIDVEN, PROCALCITON, O2SATVEN,  in the last 168 hours ABG No results found for this basename: PHART, PCO2ART, PO2ART,  in the last 168 hours Liver Enzymes No results found for this basename: AST, ALT, ALKPHOS, BILITOT, ALBUMIN,  in the last 168 hours Cardiac Enzymes No results found for this basename: TROPONINI, PROBNP,  in the last 168 hours Glucose No results found for this basename: GLUCAP,  in the last 168 hours  Imaging No results found.   ASSESSMENT / PLAN:  PULMONARY A: Pulmonary Embolism P:   -oxygen to support sats > 93% -see Heme -follow cxr to ensure no development of post infarct infectious process -pulmonary hygiene -hemodynamically stable at this time, no indication for thrombolytics   CARDIOVASCULAR A:  Hx of Pacemaker Insertion HTN  P:  -tele montioring -troponin neg x2 at [facility] -assess ECHO, EKG -continue home metoprolol  RENAL A:   No acute issues.  P:   -trend BMP  GASTROINTESTINAL A:   GERD P:   -SUP not indicated -continue pepcid  HEMATOLOGIC A:   PE  - likely provoked by physical decline / reduced mobility in the setting of dementia P:  -heparin gtt, likely can transition to lovenox / coumadin in am -heparin per pharmacy -bed rest until therapeutic on heparin or lovenox -assess LE doppler to r/o mobile or extended clot   ENDOCRINE A:   Hypothyroidism  P:   -continue synthroid  NEUROLOGIC A:   Dementia  Pain  P:   -continue namenda:  Daughter reports pt does not tolerate coming off this medication well -PRN ibuprofen, morphine for pain   Canary Brim, NP-C [facility] Pgr: [phone] or [phone]   PCCM ATTENDING: I have interviewed and examined the patient and reviewed the database. I have  formulated the assessment and plan as reflected in the note above with amendments made by me.   Billy Fischer, MD;  PCCM service; Mobile [phone]     08/21/2013, 8:43 PM

## 2013-08-21 NOTE — Progress Notes (Signed)
ANTICOAGULATION CONSULT NOTE - Initial Consult  Pharmacy Consult for heparin Indication: pulmonary embolus  Allergies  Allergen Reactions  . Adhesive [Tape]   . Latex     Patient Measurements:   Heparin Dosing Weight: 78 kg  Labs: No results found for this basename: HGB, HCT, PLT, APTT, LABPROT, INR, HEPARINUNFRC, CREATININE, CKTOTAL, CKMB, TROPONINI,  in the last 72 hours  The CrCl is unknown because both a height and weight (above a minimum accepted value) are required for this calculation.   Medical History: Past Medical History  Diagnosis Date  . Hypertension   . Sinus node dysfunction   . Ventricular tachycardia, nonsustained   . Syncope and collapse   . Pacemaker -Medtronic   . Thyroid disease     hypothyroidism  . Degenerative joint disease     Medications:  Prescriptions prior to admission  Medication Sig Dispense Refill  . colestipol (COLESTID) 5 G granules Take 1 g by mouth 3 (three) times a week.       . famotidine (PEPCID) 10 MG tablet Take 10 mg by mouth daily.      Marland Kitchen. levothyroxine (SYNTHROID, LEVOTHROID) 150 MCG tablet Take 112 mcg by mouth daily.       . memantine (NAMENDA) 10 MG tablet Take 10 mg by mouth 2 (two) times daily.      . metoprolol (LOPRESSOR) 50 MG tablet Take 50 mg by mouth 2 (two) times daily. Take 1/2 tablet by mouth  twice a day.        Assessment: Pt with acute PE. Pt was transferred from outside hospital this evening. At previous hospital she received heparin 4000 units bolus and [facility] with heparin running at 2000 units/hr.  Pertinent labs from 1/14 at OSH: Scr 1.1, hgb 13.5, plts 139, INR 1.   Goal of Therapy:  Heparin level 0.3-0.7 units/ml Monitor platelets by anticoagulation protocol: Yes   Plan:  Decrease heparin rate to 1300 units/hr Daily HL/CBC  Agapito GamesAlison Yukari Flax, PharmD, BCPS Clinical Pharmacist 08/21/2013 10:12 PM

## 2013-08-22 ENCOUNTER — Encounter (HOSPITAL_COMMUNITY): Payer: Self-pay | Admitting: Family Medicine

## 2013-08-22 DIAGNOSIS — R627 Adult failure to thrive: Secondary | ICD-10-CM

## 2013-08-22 DIAGNOSIS — I2699 Other pulmonary embolism without acute cor pulmonale: Principal | ICD-10-CM

## 2013-08-22 DIAGNOSIS — J9600 Acute respiratory failure, unspecified whether with hypoxia or hypercapnia: Secondary | ICD-10-CM

## 2013-08-22 LAB — HEPARIN LEVEL (UNFRACTIONATED)
HEPARIN UNFRACTIONATED: 0.53 [IU]/mL (ref 0.30–0.70)
Heparin Unfractionated: 0.24 IU/mL — ABNORMAL LOW (ref 0.30–0.70)

## 2013-08-22 LAB — PROTIME-INR
INR: 1.11 (ref 0.00–1.49)
Prothrombin Time: 14.1 seconds (ref 11.6–15.2)

## 2013-08-22 LAB — CBC
HCT: 37.2 % (ref 36.0–46.0)
Hemoglobin: 12.4 g/dL (ref 12.0–15.0)
MCH: 30.8 pg (ref 26.0–34.0)
MCHC: 33.3 g/dL (ref 30.0–36.0)
MCV: 92.3 fL (ref 78.0–100.0)
Platelets: 140 10*3/uL — ABNORMAL LOW (ref 150–400)
RBC: 4.03 MIL/uL (ref 3.87–5.11)
RDW: 14.8 % (ref 11.5–15.5)
WBC: 15.9 10*3/uL — AB (ref 4.0–10.5)

## 2013-08-22 LAB — BASIC METABOLIC PANEL
BUN: 11 mg/dL (ref 6–23)
CALCIUM: 8.8 mg/dL (ref 8.4–10.5)
CO2: 24 meq/L (ref 19–32)
CREATININE: 0.99 mg/dL (ref 0.50–1.10)
Chloride: 101 mEq/L (ref 96–112)
GFR calc Af Amer: 60 mL/min — ABNORMAL LOW (ref >90)
GFR, EST NON AFRICAN AMERICAN: 52 mL/min — AB (ref >90)
GLUCOSE: 127 mg/dL — AB (ref 70–99)
Potassium: 3.9 mEq/L (ref 3.7–5.3)
SODIUM: 140 meq/L (ref 137–147)

## 2013-08-22 MED ORDER — WARFARIN - PHARMACIST DOSING INPATIENT: Freq: Every day | Status: DC

## 2013-08-22 MED ORDER — WARFARIN SODIUM 5 MG PO TABS
5.0000 mg | ORAL_TABLET | Freq: Once | ORAL | Status: AC
  Administered 2013-08-22: 5 mg via ORAL
  Filled 2013-08-22 (×2): qty 1

## 2013-08-22 MED ORDER — COUMADIN BOOK
Freq: Once | Status: AC
  Administered 2013-08-22: 18:00:00
  Filled 2013-08-22 (×2): qty 1

## 2013-08-22 MED ORDER — COLESTIPOL HCL 5 G PO GRAN: 1.0000 g | GRANULES | ORAL | Status: DC

## 2013-08-22 MED ORDER — HEPARIN BOLUS VIA INFUSION
2000.0000 [IU] | Freq: Once | INTRAVENOUS | Status: AC
  Administered 2013-08-22: 2000 [IU] via INTRAVENOUS
  Filled 2013-08-22: qty 2000

## 2013-08-22 MED ORDER — WARFARIN VIDEO: Freq: Once | Status: DC

## 2013-08-22 MED FILL — Heparin Sodium (Porcine) 100 Unt/ML in Sodium Chloride 0.45%: INTRAMUSCULAR | Qty: 250 | Status: AC

## 2013-08-22 NOTE — Care Management Note (Addendum)
    Shipes 1 of 1   08/23/2013     3:50:03 PM   CARE MANAGEMENT NOTE 08/23/2013  [name]   Account Number:  [id number]  Date Initiated:  08/22/2013  Documentation initiated by:  Junius CreamerWELL,DEBBIE  Subjective/Objective Assessment:   adm w  pul edema     Action/Plan:   lives alone, has caregiver 3hrs 6days per wk. da and son ck on pt daily.   Anticipated DC Date:  08/26/2013   Anticipated DC Plan:  SKILLED NURSING FACILITY  In-house referral  Clinical Social Worker      DC Planning Services  CM consult      Choice offered to / List presented to:             Status of service:  In process, will continue to follow Medicare Important Message given?   (If response is "NO", the following Medicare IM given date fields will be blank) Date Medicare IM given:   Date Additional Medicare IM given:    Discharge Disposition:    Per UR Regulation:  Reviewed for med. necessity/level of care/duration of stay  If discussed at Long Length of Stay Meetings, dates discussed:    Comments:  08/23/13 Romell Cavanah,RN,BSN [phone] P.T./O.T. RECOMMENDING SNF AT DC FOR REHAB.  CSW FOLLOWING TO FACILITATE DC TO SNF WHEN MEDICALLY STABLE FOR DC.  WILL FOLLOW PROGRESS.  1/15 1205 debbie dowell rn,bsn spoke w pt who has dementia. pt lives at home w her dog. spoke w grandson who's mother is Transport plannerda mary beth. da and son of pt work full time. pt has caregiver 3hrs 6times per wk. da and son visit daily and pour meds. grandson states dementia worse last 1 to 1/2 yrs. pt's husb passed away in 2008. have made sw ref for poss short term snf post disch. will cont to follow.

## 2013-08-22 NOTE — Progress Notes (Signed)
VASCULAR LAB PRELIMINARY  PRELIMINARY  PRELIMINARY  PRELIMINARY  Bilateral lower extremity venous duplex  completed.    Preliminary report:  Bilateral:  No evidence of DVT, superficial thrombosis, or Baker's Cyst.    Zoe Goonan, RVT 08/22/2013, 3:01 PM

## 2013-08-22 NOTE — Progress Notes (Signed)
ANTICOAGULATION CONSULT NOTE   Pharmacy Consult for heparin Indication: pulmonary embolus  Allergies  Allergen Reactions  . Adhesive [Tape]   . Latex     Patient Measurements: Height: 5\' 3"  (160 cm) Weight: 204 lb 12.9 oz (92.9 kg) IBW/kg (Calculated) : 52.4 Heparin Dosing Weight: 78 kg  Labs:  Recent Labs  08/22/13 0328  HEPARINUNFRC 0.24*    Estimated Creatinine Clearance: 42.7 ml/min (by C-G formula based on Cr of 1.1).  Assessment: 80 yo female with PE for heparin  Goal of Therapy:  Heparin level 0.3-0.7 units/ml Monitor platelets by anticoagulation protocol: Yes   Plan:  Heparin 2000 units IV bolus, then increase heparin 1500 units/hr Check heparin level in 6 hours.  Geannie RisenGreg Avantika Shere, PharmD, BCPS  08/22/2013 4:58 AM

## 2013-08-22 NOTE — Progress Notes (Signed)
Pt was unable to void this am. Received an order to do an in and out cath after doing a bladder scan and scan showed 375 ml urine. Did an in and out cath at 1155 and obtained 500 cc of yellow/orange colored urine with small amount of sediment. Pt tolerated well. Pt was able to void on bedside commode after lunch with output at 100 cc of urine. Pt has been resting. Pt is being transferred to floor 2w rm 32. Called and gave report to Yahoo! IncKim RN. Taking pt now. Pt in room and also aware of pt's transfer.

## 2013-08-22 NOTE — Progress Notes (Signed)
S: no new complaints  O: Filed Vitals:   08/22/13 0800 08/22/13 0809 08/22/13 1019 08/22/13 1150  BP: 142/52  147/64 150/71  Pulse: 65 65 71 63  Temp:  99 F (37.2 C)  99 F (37.2 C)  TempSrc:  Oral  Oral  Resp: 25 24  22   Height:      Weight:      SpO2: 94%   95%   NAD HEENT WNL No JVD noted Diminished, no adventitious sounds RRR, no M Obese, soft, NT, NABS No edema or calf tenderness   I have reviewed all of today's lab results. Relevant abnormalities are discussed in the A/P section  No new CXR. CTA chest reviewed  Echocardiogram 1/15: LVEF 55-60%, mod dilated LA, no evidence of RV overload or PAH  IMPRESSION: Acute PE H/O GERD Hypothyroidisim Dementia - appears to be moderate in severity Adult FTT  Pt has adamantly refused NHP in past. Family concerned that it is increasingly difficult to meet her needs in home setting  PLAN: Cont heparin infusion Begin warfarin F/U Echocardiogram and LE venous Doppler studies Transfer to telemetry bed Check TSH AM 1/16 Care manager for DC planning - considering early discharge on LMWH > warfarin transition Will need lifelong anticoagulation as RF deemed to be immobility which is likely to only worsen over time  Family updated in detail. Their biggest concern is timing of discharge and making certain that all care reqts are addressed   Billy Fischeravid Simonds, MD ; [facility] service Mobile [phone].  After 5:30 PM or weekends, call [phone]

## 2013-08-22 NOTE — Progress Notes (Signed)
Utilization review completed. Lataisha Colan, RN, BSN. 

## 2013-08-22 NOTE — Progress Notes (Signed)
Clinical Social Work Department BRIEF PSYCHOSOCIAL ASSESSMENT 08/22/2013  [facility]:  [facility]     Account Number:  [id number]     Admit date:  08/21/2013  Clinical Social Worker:  Varney BilesANDERSON,Aprile Dickenson, LCSWA  Date/Time:  08/22/2013 04:51 PM  Referred by:  Physician  Date Referred:  08/22/2013 Referred for  SNF Placement   Other Referral:   Interview type:  Family Other interview type:    PSYCHOSOCIAL DATA Living Status:  ALONE Admitted from facility:   Level of care:   Primary support name:  Corrie DandyMary Obst [phone]) Primary support relationship to patient:  CHILD, ADULT Degree of support available:   Fair--pt's daughter states pt lives alone and that her children check on her pt's care needs are too great.    CURRENT CONCERNS Current Concerns  Post-Acute Placement   Other Concerns:    SOCIAL WORK ASSESSMENT / PLAN CSW received call from pt's daughter Shon HaleMary Beth, who was returning CSW voicemail from this afternoon. Shon HaleMary Beth explained that the pt lives alone and has some aides but they do not provide enough care and pt's adult children try to help but they work and they can't provide the level of care pt needs. Pt has dementia but has adamantly refused assisted living or higher level of care home. Daughter is hopeful that pt will qualify for SNF and that this can bridge the gap between hospital stay and return home so family can search for higher level of care facility that all would agree on for pt. CSW explained that she will call MD and request PT/OT order. CSW called MD and he asked CSW to call RN as he is experiencing a high case load today. CSW called RN and requested PT/OT eval for pt; RN states she will do this when she is able. CSW provided support for pt's situation and talked at length with pt's daughter about her experience providing care while her mother was living home alone. CSW explained that because pt moved to another unit she has a new CSW now. CSW explained that this  CSW will provide a handoff for the new CSW and will update new CSW on pt's situation. New CSW will call daughter tomorrow to introduce self and provide any updates to daughter. Daughter was very grateful for CSW call and looks forward to call from new CSW tomorrow.   Assessment/plan status:  Psychosocial Support/Ongoing Assessment of Needs Other assessment/ plan:   Information/referral to community resources:   Possible SNF?    PATIENT'S/FAMILY'S RESPONSE TO PLAN OF CARE: Good--pt's daughter expressed gratitude for CSW assistance and support.       Maryclare LabradorJulie Bethan Adamek, MSW, [facility]WA Clinical Social Worker [phone]

## 2013-08-22 NOTE — Progress Notes (Signed)
Echo Lab  2D Echocardiogram completed.  Braden Cimo L Khadijatou Borak, RDCS 08/22/2013 10:54 AM

## 2013-08-22 NOTE — Progress Notes (Signed)
ANTICOAGULATION CONSULT NOTE - Follow-up  Pharmacy Consult for heparin Indication: pulmonary embolus  Allergies  Allergen Reactions  . Adhesive [Tape]   . Latex     Patient Measurements: Height: 5\' 3"  (160 cm) Weight: 204 lb 12.9 oz (92.9 kg) IBW/kg (Calculated) : 52.4 Heparin Dosing Weight: 78 kg  Labs:  Recent Labs  08/22/13 0328 08/22/13 0500 08/22/13 1438  HGB  --  12.4  --   HCT  --  37.2  --   PLT  --  140*  --   LABPROT  --   --  14.1  INR  --   --  1.11  HEPARINUNFRC 0.24*  --  0.53  CREATININE 0.99  --   --     Estimated Creatinine Clearance: 47.4 ml/min (by C-G formula based on Cr of 0.99).  Assessment: [age] yof continues on IV heparin for acute PE. Also to add coumadin today. Heparin level is therapeutic at 0.53. H/H is WNL and plts are slightly low. Baseline INR is normal. No bleeding noted.  Goal of Therapy:  Heparin level 0.3-0.7 units/ml Monitor platelets by anticoagulation protocol: Yes INR 2-3   Plan:  1. Continue heparin gtt 1500 units/hr 2. Coumadin 5mg  PO x 1 tonight 3. Daily INR, CBC and heparin level 4. Coumadin book and video to patient  Lysle PearlRachel Jaegar Croft, PharmD, BCPS Pager # [phone] 08/22/2013 3:47 PM

## 2013-08-22 NOTE — Progress Notes (Signed)
CS[facility] stating pt's daughter requested to speak with CSW, as they currently have some help in the home for pt but they think she needs a higher level of care. CSW called pt's daughter Shon HaleMary Beth and left a voicemail explaining CSW got the referral from [facility] and wanted to talk with daughter to see if there is anything CSW can do to help look into resources for level of care needs. PT/OT have not been ordered for pt, and from MD notes it appears that pt will be discharged soon. CSW will explain to pt that they can begin to look into skilled nursing, and that pt's primary care MD can make this referral if they believe it is best for pt and she is discharged before we can arrange this in the hospital.  Maryclare LabradorJulie Caydence Enck, MSW, [facility] Center[facility]WA Clinical Social Worker [phone]

## 2013-08-23 LAB — CBC
HEMATOCRIT: 37.3 % (ref 36.0–46.0)
Hemoglobin: 12.2 g/dL (ref 12.0–15.0)
MCH: 30.3 pg (ref 26.0–34.0)
MCHC: 32.7 g/dL (ref 30.0–36.0)
MCV: 92.8 fL (ref 78.0–100.0)
PLATELETS: 147 10*3/uL — AB (ref 150–400)
RBC: 4.02 MIL/uL (ref 3.87–5.11)
RDW: 14.7 % (ref 11.5–15.5)
WBC: 12.2 10*3/uL — AB (ref 4.0–10.5)

## 2013-08-23 LAB — PROTIME-INR
INR: 1.07 (ref 0.00–1.49)
PROTHROMBIN TIME: 13.7 s (ref 11.6–15.2)

## 2013-08-23 LAB — TSH: TSH: 3.377 u[IU]/mL (ref 0.350–4.500)

## 2013-08-23 LAB — HEPARIN LEVEL (UNFRACTIONATED): Heparin Unfractionated: 0.43 IU/mL (ref 0.30–0.70)

## 2013-08-23 MED ORDER — HYDRALAZINE HCL 20 MG/ML IJ SOLN
10.0000 mg | INTRAMUSCULAR | Status: DC | PRN
  Administered 2013-08-23: 10 mg via INTRAVENOUS
  Filled 2013-08-23: qty 1

## 2013-08-23 MED ORDER — WARFARIN SODIUM 5 MG PO TABS
5.0000 mg | ORAL_TABLET | Freq: Once | ORAL | Status: AC
  Administered 2013-08-23: 5 mg via ORAL
  Filled 2013-08-23: qty 1

## 2013-08-23 NOTE — Progress Notes (Addendum)
Clinical Social Work Department CLINICAL SOCIAL WORK PLACEMENT NOTE 08/24/2013  Patient:  [name]  Account Number:  [id number] [place] date:  08/21/2013  Clinical Social Worker:  Blima Rich, Latanya Presser  Date/time:  08/23/2013 03:19 PM  Clinical Social Work is seeking post-discharge placement for this patient at the following level of care:   SKILLED NURSING   (*CSW will update this form in Epic as items are completed)   08/23/2013  Patient/family provided with [place] Department of Clinical Social Work's list of facilities offering this level of care within the geographic area requested by the patient (or if unable, by the patient's family).  08/23/2013  Patient/family informed of their freedom to choose among providers that offer the needed level of care, that participate in Medicare, Medicaid or managed care program needed by the patient, have an available bed and are willing to accept the patient.  08/23/2013  Patient/family informed of [facility]' ownership interest in [facility], as well as of the fact that they are under no obligation to receive care at this facility.  PASARR submitted to EDS on 08/23/2013 PASARR number received from EDS on 08/23/2013  FL2 transmitted to all facilities in geographic area requested by pt/family on  08/23/2013 FL2 transmitted to all facilities within larger geographic area on   Patient informed that his/her managed care company has contracts with or will negotiate with  certain facilities, including the following:     Patient/family informed of bed offers received:  08/24/2013 Patient chooses bed at OTHER Physician recommends and patient chooses bed at    Patient to be transferred to OTHER on  08/24/2013 Patient to be transferred to facility by [facility]  The following physician request were entered in Epic:   Additional Comments: CSW met with daughter [name] at bedside. Daughter's first choice is [facility] and  second choice is [facility].  08/24/13: Patient being discharged to [facility] in [place]. Patient and family agreeable.  Tilden Fossa, MSW, [place] Clinical Social Worker [facility] Dept. [phone]

## 2013-08-23 NOTE — Evaluation (Signed)
Physical Therapy Evaluation Patient Details Name: [name] MRN: [id number] DOB: 01/22/1929 Today's Date: 08/23/2013 Time: [id number] PT Time Calculation (min): 44 min  PT Assessment / Plan / Recommendation History of Present Illness  Patient is an 80 yo female admitted with right-sided CP.  CTA was positive for PE.  Patient with h/o HTN, Pacemaker, dementia.  Clinical Impression  Patient presents with problems listed below.  Will benefit from acute PT to maximize independence prior to discharge.  Patient lives alone and does not have 24 hour assist.  Recommend SNF at discharge for continued therapy.    PT Assessment  Patient needs continued PT services    Follow Up Recommendations  SNF    Does the patient have the potential to tolerate intense rehabilitation      Barriers to Discharge Decreased caregiver support Patient lives alone and does not have 24 hour assist available.    Equipment Recommendations  Rolling walker with 5" wheels    Recommendations for Other Services     Frequency Min 3X/week    Precautions / Restrictions Precautions Precautions: Fall Restrictions Weight Bearing Restrictions: No   Pertinent Vitals/Pain       Mobility  Bed Mobility Overal bed mobility: Needs Assistance Bed Mobility: Rolling;Sidelying to Sit;Sit to Supine Rolling: Min guard (with rail and HOB flat) Sidelying to sit: Min assist (HOB flat) Sit to supine: Min assist (HOB flat) General bed mobility comments: Verbal cues for technique with HOB flat.  Required assist to raise trunk to sitting.  Once upright, patient with good sitting balance.  Assist to bring LE's onto bed to return to supine. Transfers Overall transfer level: Needs assistance Equipment used: Rolling walker (2 wheeled) Transfers: Sit to/from Stand Sit to Stand: Min assist;+2 safety/equipment General transfer comment: Verbal cues for hand placement, and to stand for several seconds prior to  ambulation. Ambulation/Gait Ambulation/Gait assistance: Min assist;+2 safety/equipment Ambulation Distance (Feet): 108 Feet Assistive device: Rolling walker (2 wheeled) Gait Pattern/deviations: Step-through pattern;Decreased step length - right;Decreased step length - left;Staggering left;Staggering right Gait velocity: Instructed patient to move more slowly for safety.  Initially moving too quickly-out of control. General Gait Details: Attempted ambulation without AD - patient very unsteady reaching for wall and sink.  Verbal cues for safe use of RW.  Patient with quick pace to point of being unsteady and out of control with RW.  Encouraged patient to slow pace to safe speed.  Balance improved with use of RW and slow gait speed.    Exercises     PT Diagnosis: Difficulty walking;Abnormality of gait;Generalized weakness;Acute pain;Altered mental status  PT Problem List: Decreased strength;Decreased activity tolerance;Decreased balance;Decreased mobility;Decreased cognition;Decreased knowledge of use of DME;Decreased safety awareness;Cardiopulmonary status limiting activity;Pain PT Treatment Interventions: DME instruction;Gait training;Functional mobility training;Therapeutic exercise;Cognitive remediation;Patient/family education     PT Goals(Current goals can be found in the care plan section) Acute Rehab PT Goals Patient Stated Goal: get better PT Goal Formulation: With patient/family Time For Goal Achievement: 09/06/13 Potential to Achieve Goals: Good  Visit Information  Last PT Received On: 08/23/13 Assistance Needed: +2 (lines) History of Present Illness: Patient is an 80 yo female admitted with right-sided CP.  CTA was positive for PE.  Patient with h/o HTN, Pacemaker, dementia.       Prior Functioning  Home Living Family/patient expects to be discharged to:: Skilled nursing facility Living Arrangements: Alone Available Help at Discharge: Skilled Nursing Facility Type of Home:  House Home Access: Stairs to enter Enter[employer] CorporationEntrance Stairs-Number of Steps: 3  Home Layout: One level Home Equipment: Cane - quad;Shower seat Prior Function Level of Independence: Needs assistance Gait / Transfers Assistance Needed: Patient holds on to furniture/walls to ambulate in house. Holds on to family when outside of home.  Minimal ambulation pta.  Sits in chair. ADL's / Homemaking Assistance Needed: Assist for bathing, meal prep and housekeeping. Communication Communication: HOH Dominant Hand: Right    Cognition  Cognition Arousal/Alertness: Awake/alert Behavior During Therapy: Impulsive;Restless Overall Cognitive Status: History of cognitive impairments - at baseline (Disoriented to time, situation. Repeating self during sessio) Memory: Decreased short-term memory    Extremity/Trunk Assessment Upper Extremity Assessment Upper Extremity Assessment: Defer to OT evaluation Lower Extremity Assessment Lower Extremity Assessment: Generalized weakness Cervical / Trunk Assessment Cervical / Trunk Assessment: Normal   Balance Balance Overall balance assessment: Needs assistance Sitting-balance support: No upper extremity supported;Feet supported Sitting balance-Leahy Scale: Good Standing balance support: Single extremity supported Standing balance-Leahy Scale: Fair  End of Session PT - End of Session Equipment Utilized During Treatment: Gait belt Activity Tolerance: Patient limited by fatigue Patient left: in bed;with call bell/phone within reach;with bed alarm set;with family/visitor present Nurse Communication: Mobility status  GP     Vena Austria 08/23/2013, 3:33 PM Durenda Hurt. Renaldo Fiddler, [facility] Acute Rehab Services Pager [phone]

## 2013-08-23 NOTE — Progress Notes (Signed)
Md on call paged  about pt's BP of 179/95; awaiting call back.   [name]

## 2013-08-23 NOTE — Progress Notes (Signed)
eLink Physician-Brief Progress Note Patient Name: [name] DOB: 11/09/1928 MRN: [id number]  Date of Service  08/23/2013   HPI/Events of Note  Stemler from answering service but numbers strung together.  Attempted to call but received recording that number disconnected.  Sent message to colleague if they recognized the number which they did not.  Later received call to elink from answering service that nurse was attempting to contact PCCM regarding hypertension with systolic BP greater than 170 mmHg on scheduled labetalol.  Current HR is in the 60s.   eICU Interventions  Plan: Hydralazine 10 mg IV q4 hours prn systolic BP greater than 170 mmHg [phone]   Intervention Category Intermediate Interventions: Hypertension - evaluation and management  Kron Everton,[place] 08/23/2013, 7:00 AM

## 2013-08-23 NOTE — Evaluation (Signed)
Occupational Therapy Evaluation Patient Details Name: [name] MRN: [id number] DOB: September 28, 1928 Today's Date: 08/23/2013 Time: 4098-1191 OT Time Calculation (min): 38 min  OT Assessment / Plan / Recommendation History of present illness 80 y/o F, former smoker, with PMH of HTN, VT, Sinus node dysfunction s/p pacemaker, Syncope, Hypothyroidism, DJD and dementia who presented to [facility] on 1/14 with 24 hour complaint of right sided chest pain.  Daughter reports patient began complaining of chest pain after dinner on 1/13, was given tylenol per caretaker and had some improvement.  She woke am of admit with ongoing chest pain.  She was seen at [facility] outpatient center and transferred to the ER for further testing.  Patient was worked up for gallbladder, cardiac source of chest pain.  She continued to have pain and was medicated with morphine in ER.  Further had CTA Chest with findings positive for PE with RV strain, RML opacity concerning for infarct vs hemorrhage .  Patient transferred to [facility] for further care.     Clinical Impression   Pt demos decline in function with ADLs and ADL mobility with decreased strength, balance, endurance and cognitive impairments. Pt would benefit form acute OT services to address impairments to increase level of function and safety    OT Assessment  Patient needs continued OT Services    Follow Up Recommendations  SNF;Supervision/Assistance - 24 hour    Barriers to Discharge Decreased caregiver support Pt's family planing for pt to d/c to SNF for short term rehab. Pt's dtr very concerned about her eventual return home due to dementia/cognitive impairments  Equipment Recommendations  None recommended by OT;Other (comment) (TBD at next venue of care)    Recommendations for Other Services    Frequency  Min 2X/week    Precautions / Restrictions Precautions Precautions: Fall Restrictions Weight Bearing Restrictions: No   Pertinent Vitals/Pain  6/10 R upper chest area soreness    ADL  Grooming: Performed;Wash/dry hands;Wash/dry face;Min guard Where Assessed - Grooming: Supported standing Upper Body Bathing: Simulated;Supervision/safety;Set up Where Assessed - Upper Body Bathing: Unsupported sitting Lower Body Bathing: Simulated;Moderate assistance Upper Body Dressing: Performed;Supervision/safety;Set up Where Assessed - Upper Body Dressing: Unsupported sitting Lower Body Dressing: Performed;Moderate assistance Toilet Transfer: Performed;Minimal assistance Toilet Transfer Method: Sit to stand Toilet Transfer Equipment: Bedside commode Toileting - Clothing Manipulation and Hygiene: Performed;Minimal assistance Where Assessed - Engineer, mining and Hygiene: Standing Tub/Shower Transfer Method: Not assessed Transfers/Ambulation Related to ADLs: cue for safety, correct hand placement    OT Diagnosis: Generalized weakness  OT Problem List: Decreased strength;Decreased knowledge of use of DME or AE;Decreased activity tolerance;Impaired balance (sitting and/or standing);Decreased safety awareness OT Treatment Interventions: Self-care/ADL training;Therapeutic exercise;Patient/family education;Neuromuscular education;Balance training;Therapeutic activities;DME and/or AE instruction   OT Goals(Current goals can be found in the care plan section) Acute Rehab OT Goals Patient Stated Goal: get better OT Goal Formulation: With patient/family Time For Goal Achievement: 08/30/13 Potential to Achieve Goals: Good ADL Goals Pt Will Perform Grooming: with set-up;with supervision;standing Pt Will Perform Lower Body Bathing: with min assist;sit to/from stand Pt Will Perform Lower Body Dressing: with min assist;sit to/from stand Pt Will Transfer to Toilet: with min guard assist;ambulating;regular height toilet;bedside commode;grab bars Pt Will Perform Toileting - Clothing Manipulation and hygiene: with min guard assist;with  supervision;sit to/from stand  Visit Information  Last OT Received On: 08/23/13 Assistance Needed: +1 History of Present Illness: 80 y/o F, former smoker, with PMH of HTN, VT, Sinus node dysfunction s/p pacemaker, Syncope, Hypothyroidism, DJD and  dementia who presented to [facility] on 1/14 with 24 hour complaint of right sided chest pain.  Daughter reports patient began complaining of chest pain after dinner on 1/13, was given tylenol per caretaker and had some improvement.  She woke am of admit with ongoing chest pain.  [facility] was [facility] and transferred to the ER for further testing.  Patient was worked up for gallbladder, cardiac source of chest pain.  She continued to have pain and was medicated with morphine in ER.  Further had CTA Chest with findings positive for PE with RV strain, RML opacity concerning for infarct vs hemorrhage .  Patient [facility] for further care.         Prior Functioning     Home Living Family/patient expects to be discharged to:: Private residence Living Arrangements: Alone Available Help at Discharge: Skilled Nursing Facility Type of Home: House Home Access: Stairs to enter Secretary/administratorntrance Stairs-Number of Steps: 3 Home Layout: One level Home Equipment: Shower seat Prior Function Level of Independence: Needs assistance Gait / Transfers Assistance Needed: has cane ,and uses out in the community but does not use in the house. holds onto furniture ADL's / Homemaking Assistance Needed: asssitance with bathing, can dress feed and toilet herself Communication Communication: HOH Dominant Hand: Right         Vision/Perception Vision - History Baseline Vision: Wears glasses all the time Patient Visual Report: No change from baseline Perception Perception: Within Functional Limits   Cognition  Cognition Arousal/Alertness: Awake/alert Behavior During Therapy: WFL for tasks assessed/performed Overall Cognitive Status:  History of cognitive impairments - at baseline Memory: Decreased short-term memory    Extremity/Trunk Assessment Upper Extremity Assessment Upper Extremity Assessment: Overall WFL for tasks assessed;Generalized weakness Lower Extremity Assessment Lower Extremity Assessment: Defer to PT evaluation Cervical / Trunk Assessment Cervical / Trunk Assessment: Normal     Mobility Bed Mobility Overal bed mobility: Modified Independent Transfers Overall transfer level: Needs assistance Transfers: Sit to/from Stand Sit to Stand: Min assist General transfer comment: cue for safety, correct hand placement     Exercise     Balance Balance Overall balance assessment: Needs assistance Sitting-balance support: No upper extremity supported;Feet supported Sitting balance-Leahy Scale: Good Standing balance support: Single extremity supported;During functional activity Standing balance-Leahy Scale: Fair   End of Session OT - End of Session Equipment Utilized During Treatment: Gait belt;Other (comment) (BSC) Activity Tolerance: Patient tolerated treatment well Patient left: in bed;with call bell/phone within reach;with family/visitor present  GO     Galen ManilaSpencer, Shardae Kleinman Jeanette 08/23/2013, 3:04 PM

## 2013-08-23 NOTE — Progress Notes (Signed)
S:Htn during the night. Hydralazine ordered. Note she was on lopressor as opt.   O: Filed Vitals:   08/22/13 1600 08/22/13 1624 08/22/13 1900 08/23/13 0637  BP: 160/64 177/69 181/80 179/95  Pulse: 65 67 74 77  Temp:  100.2 F (37.9 C) 99.2 F (37.3 C) 98.3 F (36.8 C)  TempSrc:    Axillary  Resp: 21 22 18 17   Height:      Weight:      SpO2: 93% 94% 92% 93%   NAD HEENT WNL No JVD noted Diminished, no adventitious sounds RRR, no M Obese, soft, NT, NABS No edema or calf tenderness    Recent Labs Lab 08/22/13 0328  NA 140  K 3.9  CL 101  CO2 24  BUN 11  CREATININE 0.99  GLUCOSE 127*    Recent Labs Lab 08/22/13 0500 08/23/13 0330  HGB 12.4 12.2  HCT 37.2 37.3  WBC 15.9* 12.2*  PLT 140* 147*    Lab Results  Component Value Date   INR 1.07 08/23/2013   INR 1.11 08/22/2013   INR 0.9 04/19/2012     No new CXR. CTA chest reviewed  Echocardiogram 1/15: LVEF 55-60%, mod dilated LA, no evidence of RV overload or PAH  IMPRESSION: Acute PE H/O GERD Hypothyroidisim Dementia - appears to be moderate in severity Adult FTT  Pt has adamantly refused NHP in past. Family concerned that it is increasingly difficult to meet her needs in home setting  PLAN: Cont heparin infusion Begin warfarin F/U Echocardiogram(ef 55% diastolic grade 1) and LE venous Doppler studies(neg) telemetry bed Check TSH AM 1/16(ordered) Care manager for DC planning - considering early discharge on LMWH > warfarin transition Will need lifelong anticoagulation as RF deemed to be immobility which is likely to only worsen over time  Family updated in detail. Their biggest concern is timing of discharge and making certain that all care reqts are addressed.  Case manager working on placement. LMWH is a viable bridge if placement found   [facility] Minor ACNP [facility] PCCM Pager [phone] till 3 pm If no answer [place] [phone] 08/23/2013, 8:48 AM  After 5:30 PM or weekends, call  [phone]   STAFF NOTE: I, Dr Lavinia SharpsM Westley Blass have personally reviewed patient's available data, including medical history, events of note, physical examination and test results as part of my evaluation. I have discussed with resident/NP and other care providers such as pharmacist, RN and RRT.  In addition,  I personally evaluated patient and elicited key findings of acute PE now on anticoagulation. D/w Son - while goal is life long anti-coagulation this has to be blanced against bleed/fall risk every few months. Currently not falling but as she ages this can happen. Son agrees.   Rest per NP/medical resident whose note is outlined above and that I agree with   Dr. Kalman ShanMurali Ebenezer Mccaskey, M.D., [facility]C.P Pulmonary and Critical Care Medicine Staff Physician [facility] Pulmonary and Critical Care Pager: [phone], If no answer or between  15:00h - 7:00h: call [phone]  08/23/2013 10:15 AM

## 2013-08-23 NOTE — Progress Notes (Signed)
ANTICOAGULATION CONSULT NOTE - Follow-up  Pharmacy Consult for heparin Indication: pulmonary embolus  Allergies  Allergen Reactions  . Adhesive [Tape]   . Latex     Patient Measurements: Height: 5\' 3"  (160 cm) Weight: 204 lb 12.9 oz (92.9 kg) IBW/kg (Calculated) : 52.4 Heparin Dosing Weight: 78 kg  Labs:  Recent Labs  08/22/13 0328 08/22/13 0500 08/22/13 1438 08/23/13 0330  HGB  --  12.4  --  12.2  HCT  --  37.2  --  37.3  PLT  --  140*  --  147*  LABPROT  --   --  14.1 13.7  INR  --   --  1.11 1.07  HEPARINUNFRC 0.24*  --  0.53 0.43  CREATININE 0.99  --   --   --     Estimated Creatinine Clearance: 47.4 ml/min (by C-G formula based on Cr of 0.99).  Assessment: [age] yof continues on IV heparin for acute PE, warfarin added 1/15 day 1/5 overlap. Heparin drip 1500 uts/hr heparin level is therapeutic at 0.43. H/H is WNL and pltc stable. Baseline INR is normal. No bleeding noted.  Goal of Therapy:  Heparin level 0.3-0.7 units/ml Monitor platelets by anticoagulation protocol: Yes INR 2-3   Plan:  1. Continue heparin gtt 1500 units/hr 2. Coumadin 5mg  PO x 1 tonight 3. Daily INR, CBC and heparin level  Brianna SauersLisa Edilson Vital Pharm.D. CPP, BCPS Clinical Pharmacist [phone] 08/23/2013 12:49 PM

## 2013-08-24 LAB — BASIC METABOLIC PANEL
BUN: 9 mg/dL (ref 6–23)
CO2: 21 meq/L (ref 19–32)
Calcium: 8.5 mg/dL (ref 8.4–10.5)
Chloride: 108 mEq/L (ref 96–112)
Creatinine, Ser: 0.88 mg/dL (ref 0.50–1.10)
GFR calc Af Amer: 69 mL/min — ABNORMAL LOW (ref >90)
GFR calc non Af Amer: 60 mL/min — ABNORMAL LOW (ref >90)
GLUCOSE: 122 mg/dL — AB (ref 70–99)
POTASSIUM: 4.1 meq/L (ref 3.7–5.3)
SODIUM: 142 meq/L (ref 137–147)

## 2013-08-24 LAB — CBC
HEMATOCRIT: 34.9 % — AB (ref 36.0–46.0)
Hemoglobin: 11.8 g/dL — ABNORMAL LOW (ref 12.0–15.0)
MCH: 31 pg (ref 26.0–34.0)
MCHC: 33.8 g/dL (ref 30.0–36.0)
MCV: 91.6 fL (ref 78.0–100.0)
Platelets: 179 10*3/uL (ref 150–400)
RBC: 3.81 MIL/uL — AB (ref 3.87–5.11)
RDW: 14.9 % (ref 11.5–15.5)
WBC: 10.3 10*3/uL (ref 4.0–10.5)

## 2013-08-24 LAB — PROTIME-INR
INR: 0.98 (ref 0.00–1.49)
Prothrombin Time: 12.8 seconds (ref 11.6–15.2)

## 2013-08-24 LAB — HEPARIN LEVEL (UNFRACTIONATED): Heparin Unfractionated: 0.16 IU/mL — ABNORMAL LOW (ref 0.30–0.70)

## 2013-08-24 MED ORDER — ENOXAPARIN SODIUM 100 MG/ML ~~LOC~~ SOLN
90.0000 mg | Freq: Two times a day (BID) | SUBCUTANEOUS | Status: DC
  Administered 2013-08-24: 90 mg via SUBCUTANEOUS
  Filled 2013-08-24 (×3): qty 1

## 2013-08-24 MED ORDER — ENOXAPARIN SODIUM 100 MG/ML ~~LOC~~ SOLN: SUBCUTANEOUS | Status: AC

## 2013-08-24 MED ORDER — WARFARIN SODIUM 7.5 MG PO TABS
7.5000 mg | ORAL_TABLET | Freq: Once | ORAL | Status: DC
  Filled 2013-08-24: qty 1

## 2013-08-24 MED ORDER — WARFARIN SODIUM 5 MG PO TABS: ORAL_TABLET | ORAL | Status: DC

## 2013-08-24 MED ORDER — WARFARIN SODIUM 7.5 MG PO TABS
7.5000 mg | ORAL_TABLET | Freq: Every day | ORAL | Status: DC
  Filled 2013-08-24: qty 1

## 2013-08-24 MED ORDER — INFLUENZA VAC SPLIT QUAD 0.5 ML IM SUSP
0.5000 mL | INTRAMUSCULAR | Status: DC
  Filled 2013-08-24: qty 0.5

## 2013-08-24 MED ORDER — WARFARIN SODIUM 5 MG PO TABS: ORAL_TABLET | ORAL | Status: AC

## 2013-08-24 MED ORDER — ACETAMINOPHEN 325 MG PO TABS: 650.0000 mg | ORAL_TABLET | ORAL | Status: AC | PRN

## 2013-08-24 NOTE — Progress Notes (Signed)
ANTICOAGULATION CONSULT NOTE - Follow Up Consult  Pharmacy Consult for heparin Indication: PE   Allergies  Allergen Reactions  . Adhesive [Tape]   . Latex     Patient Measurements: Height: 5\' 3"  (160 cm) Weight: 204 lb 12.9 oz (92.9 kg) IBW/kg (Calculated) : 52.4 Heparin Dosing Weight:   Vital Signs: Temp: 98.3 F (36.8 C) (01/17 0436) Temp src: Oral (01/17 0436) BP: 161/62 mmHg (01/17 0436) Pulse Rate: 71 (01/17 0436)  Labs:  Recent Labs  08/22/13 0328  08/22/13 0500 08/22/13 1438 08/23/13 0330 08/24/13 0330  HGB  --   < > 12.4  --  12.2 11.8*  HCT  --   --  37.2  --  37.3 34.9*  PLT  --   --  140*  --  147* 179  LABPROT  --   --   --  14.1 13.7 12.8  INR  --   --   --  1.11 1.07 0.98  HEPARINUNFRC 0.24*  --   --  0.53 0.43 0.16*  CREATININE 0.99  --   --   --   --  0.88  < > = values in this interval not displayed.  Estimated Creatinine Clearance: 53.4 ml/min (by C-G formula based on Cr of 0.88).   Assessment: Heparin for acute PE with bridge to therapeutic INR. Heparin level is subtherapeutic this am despite 2 x  Within goal at this rate. Will increase to 1700 units/hr and recheck in 8 hours.  Goal of Therapy:  Heparin level 0.3-0.7 units/ml Monitor platelets by anticoagulation protocol: Yes   Plan:  Increase heparin to 1700 units/hr and recheck level in 8 hours.   Janice CoffinEarl, Airis Barbee Jonathan 08/24/2013,6:09 AM

## 2013-08-24 NOTE — Discharge Summary (Addendum)
Physician Discharge Summary  Patient ID: Suhaylah Wampole MRN: [id number] DOB/AGE: 10-Dec-1928 80 y.o.  Admit date: 08/21/2013 Discharge date: 08/24/2013    Discharge Diagnoses:  Active Problems:   PE (pulmonary embolism)   FTT (failure to thrive) in adult    Brief Summary: Clemie General is a 80 y.o. y/o female with a PMH of HTN, VT, Sinus node dysfunction s/p pacemaker, Syncope, Hypothyroidism, DJD and dementia who presented to [facility] on 1/14 with 24 hour complaint of right sided chest pain. Further w/u with CTA Chest with findings positive for PE with RV strain, RML opacity concerning for infarct vs hemorrhage . Patient transferred to [facility] for further care. She remained hemodynamically stable with no acute indication for thrombolytics.  She was started on heparin gtt and ultimately began warfarin with heparin bridge. At time of d/c she will be transitioned to lovenox bridge. BLE dopplers were neg as below. Will need lifelong anticoagulation as risk factor deemed to be immobility which is only likely to worsen over time.  She is now medically stable and ready for d/c to SNF with close monitoring of coumadin/lovenox.    FOR SNF: d/c plan ---   - check INR 1/20 - continue warfarin as below (7.61m daily x 3 days then check INR and adjust accordingly) - lovenox as below for 3 days minimum and for 24 hours once INR therapeutic then d/c  -close monitoring INR thereafter  -lifelong anticoagulation  -cont synthroid, PPI, B blocker    Significant Diagnostic Studies:  1/14 - Admit with PE. CT Chest ([place])>>positive for PE with RV strain, RML opacity concerning for infarct vs hemorrhage 2D echo 1/15>>>EF 569-67% grade 1 diastolic dysfunction, mod dilated LA 1/15 BLE dopplers>>> neg for DVT   Consults: Pharmacy   Lines/tubes: none  Microbiology/Sepsis markers: none                                                                    Filed Vitals:   08/23/13 0637 08/23/13  1043 08/23/13 2013 08/24/13 0436  BP: 179/95 149/78 137/72 161/62  Pulse: 77  80 71  Temp: 98.3 F (36.8 C)  97.9 F (36.6 C) 98.3 F (36.8 C)  TempSrc: Axillary  Oral Oral  Resp: _0 Height:      Weight:      SpO2: 93%  96% 91%     Discharge Labs  BMET  Recent Labs Lab 08/22/13 0328 08/24/13 0330  NA 140 142  K 3.9 4.1  CL 101 108  CO2 24 21  GLUCOSE 127* 122*  BUN 11 9  CREATININE 0.99 0.88  CALCIUM 8.8 8.5     CBC   Recent Labs Lab 08/22/13 0500 08/23/13 0330 08/24/13 0330  HGB 12.4 12.2 11.8*  HCT 37.2 37.3 34.9*  WBC 15.9* 12.2* 10.3  PLT 140* 147* 179   Anti-Coagulation  Recent Labs Lab 08/22/13 1438 08/23/13 0330 08/24/13 0330  INR 1.11 1.07 0.98      Discharge Orders   Future Appointments Provider Department Dept Phone   09/12/2013 10:00 AM SDeboraha Sprang MD [facility] [phone]  Future Orders Complete By Expires   Increase activity slowly  As directed  Follow-up Information   Follow up with Carlus Pavlov, MD.   Specialty:  Family Medicine   Contact information:   [address] [place] [address] [phone]       Follow up with Virl Axe, MD. Schedule an appointment as soon as possible for a visit in 2 weeks.   Specialty:  Cardiology   Contact information:   [address] [place] [address] [phone]          Medication List    STOP taking these medications       colestipol 1 G tablet  Commonly known as:  COLESTID     colestipol 5 G granules  Commonly known as:  COLESTID      TAKE these medications       acetaminophen 325 MG tablet  Commonly known as:  TYLENOL  Take 2 tablets (650 mg total) by mouth every 4 (four) hours as needed for fever or mild pain.     enoxaparin 100 MG/ML injection  Commonly known as:  LOVENOX  90 mg q 12 hours x3 days and then have INR checked- once INR is therapeutic for 24 hours, Lovenox can be  discontinued.     famotidine 10 MG tablet  Commonly known as:  PEPCID  Take 10 mg by mouth daily.     levothyroxine 112 MCG tablet  Commonly known as:  SYNTHROID, LEVOTHROID  Take 112 mcg by mouth daily before breakfast.     metoprolol 50 MG tablet  Commonly known as:  LOPRESSOR  Take 25 mg by mouth 2 (two) times daily.     NAMENDA XR 28 MG Cp24  Generic drug:  Memantine HCl ER  Take 28 mg by mouth daily.     OVER THE COUNTER MEDICATION  Take 1 tablet by mouth every Monday, Wednesday, and Friday. microcholestibol (probiotic)     warfarin 5 MG tablet  Commonly known as:  COUMADIN  Warfarin 7.24m po x3 days and then have INR checked- once INR is therapeutic (>2) for 24 hours, Lovenox can be discontinued          Disposition: SNF -- [facility]   Discharged Condition: EAllysia Ingleshas met maximum benefit of inpatient care and is medically stable and cleared for discharge.  Patient is pending follow up as above.      Time spent on disposition:  Greater than 35 minutes.   Signed:Darlina Sicilian NP 08/24/2013  1:49 PM Pager: [phone] or [phone] *Care during the described time interval was provided by me and/or other providers on the critical care team. I have reviewed this patient's available data, including medical history, events of note, physical examination and test results as part of my evaluation.  See progress note same day   MChristinia Gully MD Pulmonary and [phone] 5:30 PM or weekends, call [phone]

## 2013-08-24 NOTE — Progress Notes (Signed)
Patient being discharged to [facility] in [place]. CSW faxed facility discharge summary & AVS. Discharge packet with patient's chart. EMS called at 3:30 pm. Patient and son-in-law Rosanne AshingJim updated, thanked CSW for assistance. CSW signing off, please re-consult if further social work needs arise.   Samuella BruinKristin Kyri Shader, MSW, LCSWA Clinical Social Worker [facility] [phone]

## 2013-08-24 NOTE — Progress Notes (Signed)
Clinical Social Worker (CSW) met with patient's daughter Stanton Kidney [phone] and son in law Clair Gulling [phone] at bed side. CSW gave them bed offers. [facility] in [place] extended a bed offer and stated that they could take patient on Saturday or Sunday if patient is medically stable for D/C. Daughter and son in law reported that they wanted the bed at [facility] and understand that patient might D/C over the weekend. CSW made RN aware that patient can D/C over the weekend if medically stable. CSW will follow and assist with D/C.   Blima Rich, [place] Weekend CSW [phone]

## 2013-08-24 NOTE — Progress Notes (Signed)
ANTICOAGULATION CONSULT NOTE - Follow Up Consult  Pharmacy Consult for warfarin and Lovenox Indication: pulmonary embolus  Allergies  Allergen Reactions  . Adhesive [Tape]   . Latex     Patient Measurements: Height: 5\' 3"  (160 cm) Weight: 204 lb 12.9 oz (92.9 kg) IBW/kg (Calculated) : 52.4  Vital Signs: Temp: 98.3 F (36.8 C) (01/17 0436) Temp src: Oral (01/17 0436) BP: 161/62 mmHg (01/17 0436) Pulse Rate: 71 (01/17 0436)  Labs:  Recent Labs  08/22/13 0328  08/22/13 0500 08/22/13 1438 08/23/13 0330 08/24/13 0330  HGB  --   < > 12.4  --  12.2 11.8*  HCT  --   --  37.2  --  37.3 34.9*  PLT  --   --  140*  --  147* 179  LABPROT  --   --   --  14.1 13.7 12.8  INR  --   --   --  1.11 1.07 0.98  HEPARINUNFRC 0.24*  --   --  0.53 0.43 0.16*  CREATININE 0.99  --   --   --   --  0.88  < > = values in this interval not displayed.  Estimated Creatinine Clearance: 53.4 ml/min (by C-G formula based on Cr of 0.88).   Assessment: [age] YOF with PE who has been on a heparin drip and was started on warfarin on 1/15. Today is D# 3 out of minimum of 5 day overlap. INR this morning is 0.98 after two doses of warfarin 5mg . Plan is for discharge today, therefore patient will need to be transitioned to Lovenox. CBC has been stable, no bleeding noted.  Goal of Therapy:  INR 2-3 Anti-Xa level 0.6-1 units/ml 4hrs after LMWH dose given Monitor platelets by anticoagulation protocol: Yes   Plan:  1. Stop heparin at 1500 2. Lovenox 90mg  subQ q12h starting at 1600 3. Warfarin 7.5mg  po x3 days and then have INR checked- once INR is therapeutic for 24 hours, Lovenox can be discontinued  Lewellyn Fultz D. Vint Pola, PharmD, BCPS Clinical Pharmacist Pager: [phone] 08/24/2013 11:57 AM

## 2013-08-24 NOTE — Progress Notes (Signed)
S: no pain, no sob   O: Filed Vitals:   08/23/13 0637 08/23/13 1043 08/23/13 2013 08/24/13 0436  BP: 179/95 149/78 137/72 161/62  Pulse: 77  80 71  Temp: 98.3 F (36.8 C)  97.9 F (36.6 C) 98.3 F (36.8 C)  TempSrc: Axillary  Oral Oral  Resp: 17  19 18   Height:      Weight:      SpO2: 93%  96% 91%   NAD RA  HEENT WNL No JVD noted Diminished, no adventitious sounds RRR, no M Obese, soft, NT, NABS No edema or calf tenderness    Recent Labs Lab 08/22/13 0328 08/24/13 0330  NA 140 142  K 3.9 4.1  CL 101 108  CO2 24 21  BUN 11 9  CREATININE 0.99 0.88  GLUCOSE 127* 122*    Recent Labs Lab 08/22/13 0500 08/23/13 0330 08/24/13 0330  HGB 12.4 12.2 11.8*  HCT 37.2 37.3 34.9*  WBC 15.9* 12.2* 10.3  PLT 140* 147* 179    Lab Results  Component Value Date   INR 0.98 08/24/2013   INR 1.07 08/23/2013   INR 1.11 08/22/2013     No new CXR. CTA chest reviewed  Echocardiogram 1/15: LVEF 55-60%, mod dilated LA, no evidence of RV overload or PAH  IMPRESSION: Acute PE s hypoxemia or RV strain H/O GERD Hypothyroidisim   - TSH 3.4 on 112 mcg per day Dementia - appears to be moderate in severity Adult FTT     PLAN: Change heparin infusion to lovenox until 3 days of INR > 2  Cont  warfarin  Will need lifelong anticoagulation as RF deemed to be immobility which is likely to only worsen over time        Sandrea HughsMichael Cornesha Radziewicz, MD Pulmonary and Critical Care Medicine [facility] [phone] After 5:30 PM or weekends, call [phone]

## 2013-09-12 ENCOUNTER — Encounter: Payer: Medicare Other | Admitting: Internal Medicine

## 2013-09-17 ENCOUNTER — Encounter: Payer: Self-pay | Admitting: Internal Medicine

## 2014-02-26 ENCOUNTER — Encounter: Payer: Self-pay | Admitting: *Deleted

## 2014-07-17 ENCOUNTER — Encounter (HOSPITAL_COMMUNITY): Payer: Self-pay | Admitting: Internal Medicine

## 2017-03-03 DIAGNOSIS — I214 Non-ST elevation (NSTEMI) myocardial infarction: Secondary | ICD-10-CM | POA: Diagnosis not present

## 2017-03-03 DIAGNOSIS — F0390 Unspecified dementia without behavioral disturbance: Secondary | ICD-10-CM | POA: Diagnosis not present

## 2017-03-03 DIAGNOSIS — I509 Heart failure, unspecified: Secondary | ICD-10-CM

## 2017-03-03 DIAGNOSIS — I10 Essential (primary) hypertension: Secondary | ICD-10-CM

## 2017-03-03 DIAGNOSIS — M109 Gout, unspecified: Secondary | ICD-10-CM | POA: Diagnosis not present

## 2017-03-03 DIAGNOSIS — K219 Gastro-esophageal reflux disease without esophagitis: Secondary | ICD-10-CM

## 2017-03-03 DIAGNOSIS — I2699 Other pulmonary embolism without acute cor pulmonale: Secondary | ICD-10-CM | POA: Diagnosis not present

## 2017-03-04 DIAGNOSIS — I214 Non-ST elevation (NSTEMI) myocardial infarction: Secondary | ICD-10-CM | POA: Diagnosis not present

## 2017-03-04 DIAGNOSIS — I2699 Other pulmonary embolism without acute cor pulmonale: Secondary | ICD-10-CM | POA: Diagnosis not present

## 2017-03-04 DIAGNOSIS — R0602 Shortness of breath: Secondary | ICD-10-CM | POA: Diagnosis not present

## 2017-03-04 DIAGNOSIS — F0390 Unspecified dementia without behavioral disturbance: Secondary | ICD-10-CM | POA: Diagnosis not present

## 2017-03-04 DIAGNOSIS — I509 Heart failure, unspecified: Secondary | ICD-10-CM | POA: Diagnosis not present

## 2017-03-04 DIAGNOSIS — I10 Essential (primary) hypertension: Secondary | ICD-10-CM | POA: Diagnosis not present

## 2017-03-05 DIAGNOSIS — I2699 Other pulmonary embolism without acute cor pulmonale: Secondary | ICD-10-CM | POA: Diagnosis not present

## 2017-03-05 DIAGNOSIS — I214 Non-ST elevation (NSTEMI) myocardial infarction: Secondary | ICD-10-CM | POA: Diagnosis not present

## 2017-03-05 DIAGNOSIS — I509 Heart failure, unspecified: Secondary | ICD-10-CM | POA: Diagnosis not present

## 2017-03-05 DIAGNOSIS — I10 Essential (primary) hypertension: Secondary | ICD-10-CM | POA: Diagnosis not present

## 2017-03-05 DIAGNOSIS — F0390 Unspecified dementia without behavioral disturbance: Secondary | ICD-10-CM | POA: Diagnosis not present

## 2017-03-06 DIAGNOSIS — I509 Heart failure, unspecified: Secondary | ICD-10-CM | POA: Diagnosis not present

## 2017-03-06 DIAGNOSIS — I2699 Other pulmonary embolism without acute cor pulmonale: Secondary | ICD-10-CM | POA: Diagnosis not present

## 2017-03-06 DIAGNOSIS — I10 Essential (primary) hypertension: Secondary | ICD-10-CM | POA: Diagnosis not present

## 2017-03-06 DIAGNOSIS — F0390 Unspecified dementia without behavioral disturbance: Secondary | ICD-10-CM | POA: Diagnosis not present

## 2017-03-06 DIAGNOSIS — I214 Non-ST elevation (NSTEMI) myocardial infarction: Secondary | ICD-10-CM | POA: Diagnosis not present

## 2017-05-17 DIAGNOSIS — M109 Gout, unspecified: Secondary | ICD-10-CM | POA: Diagnosis not present

## 2017-05-17 DIAGNOSIS — I2699 Other pulmonary embolism without acute cor pulmonale: Secondary | ICD-10-CM | POA: Diagnosis not present

## 2017-05-17 DIAGNOSIS — F0390 Unspecified dementia without behavioral disturbance: Secondary | ICD-10-CM | POA: Diagnosis not present

## 2017-05-17 DIAGNOSIS — N179 Acute kidney failure, unspecified: Secondary | ICD-10-CM

## 2017-05-17 DIAGNOSIS — I509 Heart failure, unspecified: Secondary | ICD-10-CM

## 2017-05-17 DIAGNOSIS — N390 Urinary tract infection, site not specified: Secondary | ICD-10-CM

## 2017-05-17 DIAGNOSIS — I10 Essential (primary) hypertension: Secondary | ICD-10-CM | POA: Diagnosis not present

## 2017-05-17 DIAGNOSIS — R0902 Hypoxemia: Secondary | ICD-10-CM

## 2017-05-18 DIAGNOSIS — N179 Acute kidney failure, unspecified: Secondary | ICD-10-CM | POA: Diagnosis not present

## 2017-05-18 DIAGNOSIS — R0902 Hypoxemia: Secondary | ICD-10-CM | POA: Diagnosis not present

## 2017-05-18 DIAGNOSIS — I509 Heart failure, unspecified: Secondary | ICD-10-CM | POA: Diagnosis not present

## 2017-05-18 DIAGNOSIS — N390 Urinary tract infection, site not specified: Secondary | ICD-10-CM | POA: Diagnosis not present

## 2017-05-19 DIAGNOSIS — R0902 Hypoxemia: Secondary | ICD-10-CM | POA: Diagnosis not present

## 2017-05-19 DIAGNOSIS — N390 Urinary tract infection, site not specified: Secondary | ICD-10-CM | POA: Diagnosis not present

## 2017-05-19 DIAGNOSIS — N179 Acute kidney failure, unspecified: Secondary | ICD-10-CM | POA: Diagnosis not present

## 2017-05-19 DIAGNOSIS — I509 Heart failure, unspecified: Secondary | ICD-10-CM | POA: Diagnosis not present

## 2017-08-08 DEATH — deceased
